# Patient Record
Sex: Female | Born: 1990
Health system: Southern US, Community
[De-identification: ages and names within clinical notes are randomized; demographics above are authoritative.]

## PROBLEM LIST (undated history)

## (undated) ENCOUNTER — Inpatient Hospital Stay (HOSPITAL_COMMUNITY): Payer: Self-pay

## (undated) DIAGNOSIS — R519 Headache, unspecified: Secondary | ICD-10-CM

## (undated) DIAGNOSIS — R51 Headache: Secondary | ICD-10-CM

## (undated) DIAGNOSIS — O139 Gestational [pregnancy-induced] hypertension without significant proteinuria, unspecified trimester: Secondary | ICD-10-CM

---
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
## 2008-07-22 ENCOUNTER — Observation Stay (HOSPITAL_COMMUNITY): Admission: AD | Admit: 2008-07-22 | Discharge: 2008-07-23 | Payer: Self-pay | Admitting: Obstetrics and Gynecology

## 2008-07-22 ENCOUNTER — Other Ambulatory Visit: Payer: Self-pay | Admitting: Emergency Medicine

## 2008-08-03 ENCOUNTER — Inpatient Hospital Stay (HOSPITAL_COMMUNITY): Admission: AD | Admit: 2008-08-03 | Discharge: 2008-08-07 | Payer: Self-pay | Admitting: Obstetrics and Gynecology

## 2009-10-17 ENCOUNTER — Emergency Department (HOSPITAL_COMMUNITY): Admission: EM | Admit: 2009-10-17 | Discharge: 2009-10-18 | Payer: Self-pay | Admitting: Emergency Medicine

## 2010-05-12 LAB — COMPREHENSIVE METABOLIC PANEL
ALT: 11 U/L (ref 0–35)
AST: 22 U/L (ref 0–37)
AST: 22 U/L (ref 0–37)
Albumin: 2.4 g/dL — ABNORMAL LOW (ref 3.5–5.2)
Alkaline Phosphatase: 134 U/L — ABNORMAL HIGH (ref 39–117)
BUN: 8 mg/dL (ref 6–23)
CO2: 22 mEq/L (ref 19–32)
CO2: 25 mEq/L (ref 19–32)
Calcium: 7.7 mg/dL — ABNORMAL LOW (ref 8.4–10.5)
Calcium: 8.5 mg/dL (ref 8.4–10.5)
Chloride: 101 mEq/L (ref 96–112)
Creatinine, Ser: 0.67 mg/dL (ref 0.4–1.2)
Creatinine, Ser: 0.8 mg/dL (ref 0.4–1.2)
GFR calc Af Amer: >60 mL/min (ref >60)
GFR calc Af Amer: >60 mL/min (ref >60)
GFR calc non Af Amer: >60 mL/min (ref >60)
GFR calc non Af Amer: >60 mL/min (ref >60)
Glucose, Bld: 65 mg/dL — ABNORMAL LOW (ref 70–99)
Potassium: 3.8 mEq/L (ref 3.5–5.1)
Sodium: 130 mEq/L — ABNORMAL LOW (ref 135–145)
Total Bilirubin: 0.8 mg/dL (ref 0.3–1.2)
Total Protein: 5.2 g/dL — ABNORMAL LOW (ref 6.0–8.3)

## 2010-05-12 LAB — CBC
HCT: 31.3 % — ABNORMAL LOW (ref 36.0–46.0)
HCT: 33.1 % — ABNORMAL LOW (ref 36.0–46.0)
Hemoglobin: 10.8 g/dL — ABNORMAL LOW (ref 12.0–15.0)
Hemoglobin: 11.5 g/dL — ABNORMAL LOW (ref 12.0–15.0)
MCHC: 34.6 g/dL (ref 30.0–36.0)
MCHC: 34.6 g/dL (ref 30.0–36.0)
MCHC: 34.7 g/dL (ref 30.0–36.0)
MCV: 90.5 fL (ref 78.0–100.0)
MCV: 90.5 fL (ref 78.0–100.0)
MCV: 91.7 fL (ref 78.0–100.0)
Platelets: 161 10*3/uL (ref 150–400)
Platelets: 163 10*3/uL (ref 150–400)
RBC: 3.15 MIL/uL — ABNORMAL LOW (ref 3.87–5.11)
RBC: 3.46 MIL/uL — ABNORMAL LOW (ref 3.87–5.11)
RBC: 3.66 MIL/uL — ABNORMAL LOW (ref 3.87–5.11)
RDW: 14.1 % (ref 11.5–15.5)
RDW: 14.7 % (ref 11.5–15.5)
WBC: 11.7 10*3/uL — ABNORMAL HIGH (ref 4.0–10.5)

## 2010-05-12 LAB — URINALYSIS, ROUTINE W REFLEX MICROSCOPIC
Ketones, ur: NEGATIVE mg/dL
Leukocytes, UA: NEGATIVE
Nitrite: NEGATIVE
Protein, ur: >300 mg/dL — AB
Urobilinogen, UA: 0.2 mg/dL (ref 0.0–1.0)

## 2010-05-12 LAB — LACTATE DEHYDROGENASE: LDH: 183 U/L (ref 94–250)

## 2010-05-12 LAB — URIC ACID
Uric Acid, Serum: 5.9 mg/dL (ref 2.4–7.0)
Uric Acid, Serum: 6.5 mg/dL (ref 2.4–7.0)

## 2010-05-13 LAB — POCT I-STAT, CHEM 8
BUN: 6 mg/dL (ref 6–23)
Hemoglobin: 10.2 g/dL — ABNORMAL LOW (ref 12.0–15.0)
Sodium: 135 mEq/L (ref 135–145)
TCO2: 21 mmol/L (ref 0–100)
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
## 2010-06-18 NOTE — Consult Note (Signed)
Carrie Bullock, Carrie Bullock               ACCOUNT NO.:  [id number]   MEDICAL RECORD NO.:  [id number]          PATIENT TYPE:  EMS   LOCATION:  ED                           FACILITY:  [facility]   PHYSICIAN:  Karol T. Lazarus Salines, M.D. DATE OF BIRTH:  Jun 21, 1990   DATE OF CONSULTATION:  DATE OF DISCHARGE:                                 CONSULTATION   CHIEF COMPLAINT:  Severe sore throat.   HISTORY OF PRESENT ILLNESS:  This is an 20 year old black female with a  sore throat over the past 6-7 days.  She presented to the emergency room  three days ago and received an intramuscular penicillin shot, presumably  Bicillin.  She kept the sore throat and today it became substantially  worse to the right side with radiation to the ear, tenderness in the  neck and pain with swallowing.  She has not had anything solid to eat  for more than one day.  She came into the emergency room where a right  peritonsillar abscess was identified and ENT was called in consultation.  She has never had prior trouble with an abscess in her throat, strep  throat, tonsillitis, or pharyngitis.  She is not diabetic or otherwise  immune compromised.  She is not pregnant.   PAST MEDICAL HISTORY:  She takes occasional Tylenol or Motrin.  She has  no known medical allergies.   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  She smokes between one third and one half packs per day.   FAMILY HISTORY:  Noncontributory.   REVIEW OF SYSTEMS:  Noncontributory.   PHYSICAL EXAMINATION:  GENERAL:  This is a thin adolescent black female  who smells rather strongly.  Mental status is basically appropriate.  HEENT:  She hears well in conversational speech.  Voice is phonatory but  with some dysarthria consistent with pain and swelling.  The head is  atraumatic and neck supple.  Cranial nerves intact.  Ear canals are  clear with normal aerated drums.  Anterior nose is clear and not  congested.  Oral cavity is  moist with teeth in good repair.  Oropharynx  shows chronic exudate on the surface of the right tonsil with bulging of  the right soft palate, erythema and displacement of the uvula towards  the left.  The left tonsil looks okay.  No obvious drainage.  NECK:  Slightly tender in the right jugulodigastric region but without  discrete nodes.   IMPRESSION:  Right peritonsillar abscess.   PLAN:  I discussed this with her.  I recommend incision and drainage,  preferably either under local anesthesia completely or under conscious  sedation protocol.  I do not think this will require a full general  anesthesia.  I discussed this with her and she is in agreement.  Questions were answered and informed consent was obtained.   After 2 mg of IV Versed and IV morphine, anesthesia was begun with  topical Hurricaine spray followed by injection of 1% Xylocaine with  1:100,000 epinephrine using a 22-gauge spinal needle.  She became quite  hysterical  and hyperventilatory at this point.  Two additional mg of  Versed and two additional mg of morphine allowed her to come down  satisfactorily.  The injections were completed and several minutes were  allowed for local anesthesia to take effect.   After allowing several minutes, a 2-cm crescent incision was made over  the superior pole of the right tonsil and carried down to an abscess  cavity.  Approximately 10 mL of frank pus was evacuated.  The opening  was enlarged at the tonsil hemostat and the suction tip was inserted  into the cavity and it was fully evacuated.  The patient tolerated this  reasonably well.  Hemostasis was spontaneous.  I will have her gargle  with saline and peroxide to clean up the old blood.   Prescriptions for amoxicillin liquid 1 gram t.i.d. for three days  followed by amoxicillin 500 mg t.i.d. for 1 week and Tylenol with  Codeine liquid for pain relief were written and given.  I will see her  back in one week in my office.  I  discussed instructions with her and  more importantly with her mother about advancement of diet and activity.  She knows to contact me sooner for problems.  Given an absent history of  prior tonsillitis, she does not require tonsillectomy, but if this  should happen again we will need to address this issue at that time.      Gloris Manchester. Lazarus Salines, M.D.  Electronically Signed     KTW/MEDQ  D:  02/05/2007  T:  02/05/2007  Job:  [id number]
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
## 2010-10-23 LAB — RAPID STREP SCREEN (MED CTR MEBANE ONLY): Streptococcus, Group A Screen (Direct): NEGATIVE

## 2010-12-12 ENCOUNTER — Encounter: Payer: Self-pay | Admitting: *Deleted

## 2010-12-12 ENCOUNTER — Emergency Department (HOSPITAL_COMMUNITY)
Admission: EM | Admit: 2010-12-12 | Discharge: 2010-12-12 | Disposition: A | Discharge status: Home / Self Care | Payer: Self-pay | Attending: Emergency Medicine | Admitting: Emergency Medicine

## 2010-12-12 DIAGNOSIS — F17200 Nicotine dependence, unspecified, uncomplicated: Secondary | ICD-10-CM | POA: Insufficient documentation

## 2010-12-12 DIAGNOSIS — J029 Acute pharyngitis, unspecified: Secondary | ICD-10-CM | POA: Insufficient documentation

## 2010-12-12 LAB — RAPID STREP SCREEN (MED CTR MEBANE ONLY): Streptococcus, Group A Screen (Direct): NEGATIVE

## 2010-12-12 MED ORDER — LIDOCAINE VISCOUS 2 % MT SOLN: 20.0000 mL | OROMUCOSAL | Status: DC | PRN

## 2010-12-12 MED ORDER — LIDOCAINE VISCOUS 2 % MT SOLN
20.0000 mL | Freq: Once | OROMUCOSAL | Status: AC
  Administered 2010-12-12: 20 mL via OROMUCOSAL
  Filled 2010-12-12: qty 30

## 2010-12-12 MED ORDER — IBUPROFEN 800 MG PO TABS: 800.0000 mg | ORAL_TABLET | Freq: Three times a day (TID) | ORAL | Status: DC

## 2010-12-12 MED ORDER — HYDROCODONE-ACETAMINOPHEN 5-325 MG PO TABS: 1.0000 | ORAL_TABLET | Freq: Four times a day (QID) | ORAL | Status: AC | PRN

## 2010-12-12 NOTE — ED Notes (Signed)
Pt c/o throat pain. Bilateral redness noted. Denies fevers

## 2010-12-12 NOTE — ED Notes (Signed)
Pt in c/o sore throat x1 week

## 2010-12-12 NOTE — ED Provider Notes (Signed)
Medical screening examination/treatment/procedure(s) were performed by non-physician practitioner and as supervising physician I was immediately available for consultation/collaboration.  Olivia Mackie, MD 12/12/10 [phone]

## 2010-12-12 NOTE — ED Provider Notes (Signed)
History   CSN: [id number] Arrival date & time: 12/12/2010 12:02 AM   First MD Initiated Contact with Patient 12/12/10 0027     Patient is a 20 y.o. female presenting with pharyngitis. The history is provided by the patient.  Sore Throat This is a new problem. The current episode started in the past 7 days. The problem occurs constantly. The problem has been gradually worsening. Associated symptoms include neck pain, a sore throat and swollen glands. Pertinent negatives include no abdominal pain, chest pain, chills, congestion, coughing, fever, headaches, myalgias, nausea, rash, vomiting or weakness. The symptoms are aggravated by swallowing, eating and drinking. She has tried nothing for the symptoms.  Patient reports today she found white exudate on the back of her left tonsil. Reports pain is worse on the left side of her throat. States pain radiates to her left ear when swallowing. Patient reports tender anterior cervical lymph nodes as well.  History reviewed. No pertinent past medical history.  History reviewed. No pertinent past surgical history.  History reviewed. No pertinent family history.  History  Substance Use Topics  . Smoking status: Current Everyday Smoker  . Smokeless tobacco: Not on file  . Alcohol Use: Yes    OB History    Grav Para Term Preterm Abortions TAB SAB Ect Mult Living                  Review of Systems  Constitutional: Negative for fever and chills.  HENT: Positive for sore throat, trouble swallowing and neck pain. Negative for ear pain, congestion, facial swelling, rhinorrhea, sneezing, neck stiffness, postnasal drip and sinus pressure.   Respiratory: Negative for cough, shortness of breath and wheezing.   Cardiovascular: Negative for chest pain.  Gastrointestinal: Negative for nausea, vomiting and abdominal pain.  Musculoskeletal: Negative for myalgias.  Skin: Negative for rash.  Neurological: Negative for dizziness, weakness, light-headedness and  headaches.  All other systems reviewed and are negative.    Allergies  Review of patient's allergies indicates no known allergies.  Home Medications  No current outpatient prescriptions on file.  BP 126/63  Pulse 89  Temp(Src) 98.3 F (36.8 C) (Oral)  Resp 22  SpO2 100%  Physical Exam  Vitals reviewed. Constitutional: She is oriented to person, place, and time. Vital signs are normal. She appears well-developed and well-nourished. No distress.  HENT:  Head: Normocephalic and atraumatic. No trismus in the jaw.  Right Ear: Tympanic membrane, external ear and ear canal normal.  Left Ear: Tympanic membrane, external ear and ear canal normal.  Nose: Nose normal.  Mouth/Throat: Uvula is midline and mucous membranes are normal. No dental abscesses or uvula swelling. Oropharyngeal exudate and posterior oropharyngeal erythema present. No posterior oropharyngeal edema or tonsillar abscesses.  Eyes: Pupils are equal, round, and reactive to light.  Neck: Trachea normal and normal range of motion. Neck supple. No spinous process tenderness and no muscular tenderness present. No mass and no thyromegaly present.  Pulmonary/Chest: Effort normal.  Lymphadenopathy:       Head (left side): Submandibular and tonsillar adenopathy present.  Neurological: She is alert and oriented to person, place, and time.  Skin: Skin is warm and dry. No rash noted. No erythema. No pallor.  Psychiatric: She has a normal mood and affect. Her behavior is normal.    ED Course  Procedures   Patient is a Centor Score 3. Will order Throat cultures and treat symptomatically.   MDM         Thomasene Lot,  PA 12/12/10 0101

## 2010-12-20 ENCOUNTER — Encounter (HOSPITAL_COMMUNITY): Payer: Self-pay | Admitting: Emergency Medicine

## 2010-12-20 ENCOUNTER — Emergency Department (HOSPITAL_COMMUNITY): Payer: Self-pay

## 2010-12-20 ENCOUNTER — Emergency Department (HOSPITAL_COMMUNITY)
Admission: EM | Admit: 2010-12-20 | Discharge: 2010-12-20 | Disposition: A | Discharge status: Home / Self Care | Payer: No Typology Code available for payment source | Attending: Emergency Medicine | Admitting: Emergency Medicine

## 2010-12-20 DIAGNOSIS — Y92410 Unspecified street and highway as the place of occurrence of the external cause: Secondary | ICD-10-CM | POA: Insufficient documentation

## 2010-12-20 DIAGNOSIS — M25559 Pain in unspecified hip: Secondary | ICD-10-CM

## 2010-12-20 DIAGNOSIS — F17200 Nicotine dependence, unspecified, uncomplicated: Secondary | ICD-10-CM | POA: Insufficient documentation

## 2010-12-20 DIAGNOSIS — M549 Dorsalgia, unspecified: Secondary | ICD-10-CM

## 2010-12-20 DIAGNOSIS — M545 Low back pain, unspecified: Secondary | ICD-10-CM | POA: Insufficient documentation

## 2010-12-20 DIAGNOSIS — T1490XA Injury, unspecified, initial encounter: Secondary | ICD-10-CM | POA: Insufficient documentation

## 2010-12-20 MED ORDER — HYDROCODONE-ACETAMINOPHEN 5-325 MG PO TABS: 1.0000 | ORAL_TABLET | Freq: Four times a day (QID) | ORAL | Status: AC | PRN

## 2010-12-20 NOTE — ED Notes (Signed)
Patient transported to X-ray 
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
## 2010-12-20 NOTE — ED Notes (Signed)
Patient transported to CT 
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
## 2011-06-25 ENCOUNTER — Other Ambulatory Visit (HOSPITAL_COMMUNITY)
Admission: RE | Admit: 2011-06-25 | Discharge: 2011-06-25 | Disposition: A | Discharge status: Home / Self Care | Payer: No Typology Code available for payment source | Source: Ambulatory Visit | Attending: Family Medicine | Admitting: Family Medicine

## 2011-06-25 ENCOUNTER — Other Ambulatory Visit: Payer: Self-pay | Admitting: Physician Assistant

## 2011-06-25 DIAGNOSIS — Z01419 Encounter for gynecological examination (general) (routine) without abnormal findings: Secondary | ICD-10-CM | POA: Insufficient documentation

## 2012-11-04 ENCOUNTER — Other Ambulatory Visit: Payer: Self-pay | Admitting: Physician Assistant

## 2012-11-04 ENCOUNTER — Other Ambulatory Visit (HOSPITAL_COMMUNITY)
Admission: RE | Admit: 2012-11-04 | Discharge: 2012-11-04 | Disposition: A | Discharge status: Home / Self Care | Payer: Medicaid Other | Source: Ambulatory Visit | Attending: Physician Assistant | Admitting: Physician Assistant

## 2012-11-04 DIAGNOSIS — R87810 Cervical high risk human papillomavirus (HPV) DNA test positive: Secondary | ICD-10-CM | POA: Insufficient documentation

## 2012-11-04 DIAGNOSIS — Z124 Encounter for screening for malignant neoplasm of cervix: Secondary | ICD-10-CM | POA: Insufficient documentation

## 2012-11-04 DIAGNOSIS — Z1151 Encounter for screening for human papillomavirus (HPV): Secondary | ICD-10-CM | POA: Insufficient documentation

## 2012-11-04 DIAGNOSIS — Z113 Encounter for screening for infections with a predominantly sexual mode of transmission: Secondary | ICD-10-CM | POA: Insufficient documentation

## 2012-12-15 ENCOUNTER — Other Ambulatory Visit: Payer: Self-pay | Admitting: Family Medicine

## 2013-03-11 ENCOUNTER — Emergency Department (HOSPITAL_COMMUNITY)
Admission: EM | Admit: 2013-03-11 | Discharge: 2013-03-11 | Disposition: A | Discharge status: Home / Self Care | Payer: Medicaid Other | Attending: Emergency Medicine | Admitting: Emergency Medicine

## 2013-03-11 DIAGNOSIS — Y9289 Other specified places as the place of occurrence of the external cause: Secondary | ICD-10-CM | POA: Insufficient documentation

## 2013-03-11 DIAGNOSIS — W2209XA Striking against other stationary object, initial encounter: Secondary | ICD-10-CM | POA: Insufficient documentation

## 2013-03-11 DIAGNOSIS — F17200 Nicotine dependence, unspecified, uncomplicated: Secondary | ICD-10-CM | POA: Insufficient documentation

## 2013-03-11 DIAGNOSIS — S0100XA Unspecified open wound of scalp, initial encounter: Secondary | ICD-10-CM | POA: Insufficient documentation

## 2013-03-11 DIAGNOSIS — Y9301 Activity, walking, marching and hiking: Secondary | ICD-10-CM | POA: Insufficient documentation

## 2013-03-11 DIAGNOSIS — S0101XA Laceration without foreign body of scalp, initial encounter: Secondary | ICD-10-CM

## 2013-03-11 MED ORDER — IBUPROFEN 800 MG PO TABS
800.0000 mg | ORAL_TABLET | Freq: Once | ORAL | Status: AC
  Administered 2013-03-11: 800 mg via ORAL
  Filled 2013-03-11: qty 1

## 2013-03-11 NOTE — ED Notes (Signed)
Pt hit head on cabinet in kitchen. Pt has lac to L side of head near forehead. Bleeding controlled. Pt denies LOC, nausea/vomiting. Pt alert, no acute distress.

## 2013-03-11 NOTE — ED Provider Notes (Signed)
CSN: [id number]     Arrival date & time 03/11/13  2228 History  This chart was scribed for Junius Finner, non-physician practitioner, working with Toy Baker, MD by Bennett Scrape, ED Scribe. This patient was seen in room WTR5/WTR5 and the patient's care was started at 10:50 PM.   Chief Complaint  Patient presents with  . Head Laceration    The history is provided by the patient. No language interpreter was used.    HPI Comments: Carrie Bullock is a 23 y.o. female who presents to the Emergency Department complaining of laceration to the left head that occurred when she hit her head on a kitchen cabinet 45 minutes ago. She states that the wooden cabinet door was open and she walked into it not realizing that it was open. The bleeding is controlled currently. She reports a mild burning pain associated with the laceration and rates her pain a 2 out of 10. She denies taking any OTC medications for the pain. She denies any LOC, nausea or emesis. She denies any other injuries. She denies having any h/o chronic medical problems. TD vaccine is UTD.  PCP is with [facility].   No past medical history on file. No past surgical history on file. Family History  Problem Relation Age of Onset  . Hypertension Mother    History  Substance Use Topics  . Smoking status: Current Every Day Smoker  . Smokeless tobacco: Not on file  . Alcohol Use: Yes   OB History   Grav Para Term Preterm Abortions TAB SAB Ect Mult Living   1 1             Review of Systems  Gastrointestinal: Negative for nausea and vomiting.  Skin: Positive for wound.  Neurological: Negative for syncope.  All other systems reviewed and are negative.    Allergies  Review of patient's allergies indicates no known allergies.  Home Medications   Current Outpatient Rx  Name  Route  Sig  Dispense  Refill  . aspirin-acetaminophen-caffeine (EXCEDRIN MIGRAINE) 250-250-65 MG per tablet   Oral   Take 1 tablet by mouth  every 6 (six) hours as needed for headache.           Triage Vitals: BP 142/81  Pulse 94  Temp(Src) 98.5 F (36.9 C) (Oral)  Resp 16  SpO2 100%  Physical Exam  Nursing note and vitals reviewed. Constitutional: She is oriented to person, place, and time. She appears well-developed and well-nourished.  HENT:  Head: Normocephalic and atraumatic.  2 cm skin flap to the left parietal scalp. Bleeding is controlled  Eyes: EOM are normal. Pupils are equal, round, and reactive to light.  Neck: Normal range of motion. Neck supple.  Cardiovascular: Normal rate.   Pulmonary/Chest: Effort normal.  Musculoskeletal: Normal range of motion.  Neurological: She is alert and oriented to person, place, and time. Coordination and gait normal. GCS eye subscore is 4. GCS verbal subscore is 5. GCS motor subscore is 6.  Skin: Skin is warm and dry.  Psychiatric: She has a normal mood and affect. Her behavior is normal.    ED Course  Procedures (including critical care time)  DIAGNOSTIC STUDIES: Oxygen Saturation is 100% on RA, normal by my interpretation.    COORDINATION OF CARE: 10:54 PM-Discussed treatment plan which includes wound care and one staple with pt at bedside and pt agreed to plan.   LACERATION REPAIR PROCEDURE NOTE The patient's identification was confirmed and consent was obtained.  This procedure was performed by Junius FinnerErin O'Malley, PA-C at 10:55 PM. Site: left parietal Sterile procedures observed Anesthetic used (type and amt): N/A Suture type/size: staple Length: 2 cm # of Staple: 1 Technique:well-approximated  Complexity: Simple Antibx ointment applied Tetanus UTD  Site irrigated with NS, explored without evidence of foreign body, wound well approximated, site covered with dry, sterile dressing.  Patient tolerated procedure well without complications. Instructions for care discussed verbally and patient provided with additional written instructions for homecare and  f/u.  Labs Review Labs Reviewed - No data to display Imaging Review No results found.  EKG Interpretation   None       MDM   1. Scalp laceration    Pt with 2cm head laceration after hitting cabinet this evening. No LOC, normal neuro exam. Do not believe imaging needed at this time. One staple placed. Advised to f/u with PCP or return to ED if needed in 5-7 days for staple removal. Return precautions provided. Pt verbalized understanding and agreement with tx plan.   I personally performed the services described in this documentation, which was scribed in my presence. The recorded information has been reviewed and is accurate.    Junius Finnerrin O'Malley, PA-C 03/11/13 2330

## 2013-03-12 NOTE — ED Provider Notes (Signed)
Medical screening examination/treatment/procedure(s) were performed by non-physician practitioner and as supervising physician I was immediately available for consultation/collaboration.  Toy BakerAnthony T Kynslei Art, MD 03/12/13 [phone]

## 2013-03-24 ENCOUNTER — Emergency Department (HOSPITAL_COMMUNITY)
Admission: EM | Admit: 2013-03-24 | Discharge: 2013-03-24 | Disposition: A | Discharge status: Home / Self Care | Payer: Medicaid Other | Attending: Emergency Medicine | Admitting: Emergency Medicine

## 2013-03-24 DIAGNOSIS — Z4802 Encounter for removal of sutures: Secondary | ICD-10-CM

## 2013-03-24 DIAGNOSIS — F17200 Nicotine dependence, unspecified, uncomplicated: Secondary | ICD-10-CM | POA: Insufficient documentation

## 2013-03-24 NOTE — Discharge Instructions (Signed)
Read the information below.  You may return to the Emergency Department at any time for worsening condition or any new symptoms that concern you.  If you develop redness, swelling, pus draining from the wound, or fevers greater than 100.4, return to the ER immediately for a recheck.   ° ° °Staple Removal °Care After °The staples used to close your skin have been removed. The wound needs continued care so it can heal completely and without problems. The care described here will need to be done for another 5-10 days unless your caregiver advises otherwise.  °HOME CARE INSTRUCTIONS  °· Keep wound site dry and clean. °· If skin adhesive strips were applied after the staples were removed, they will begin to peel off in a few days. If they remain after fourteen days, they may be peeled off and discarded. °· If you still have a dressing, change it at least once a day or as instructed by your caregiver. If the bandage sticks, soak it off with warm water. Pat dry with a clean towel. Look for signs of infection (see below). °· Reapply cream or ointment according to your caregiver's instruction. This will help prevent infection and keep the bandage from sticking. Use of a non-stick material over the wound and under the dressing or wrap will also help keep the bandage from sticking. °· If the bandage becomes wet, dirty or develops a foul smell, change it as soon as possible. °· New scars become sunburned easily. Use sunscreens with protection factor (SPF) of at least 15 when out in the sun. °· Only take over-the-counter or prescription medicines for pain, discomfort or fever as directed by your caregiver. °SEEK IMMEDIATE MEDICAL CARE IF:  °· There is redness, swelling or increasing pain in the wound. °· Pus is coming from the wound. °· An unexplained oral temperature above 102° F (38.9° C) develops. °· You notice a foul smell coming from the wound or dressing. °· There is a breaking open of the suture line (edges not staying  together) of the wound edges after staples have been removed. °Document Released: 01/03/2008 Document Revised: 04/14/2011 Document Reviewed: 01/03/2008 °ExitCare® Patient Information ©2014 [employer]. ° °

## 2013-03-24 NOTE — ED Notes (Signed)
Pt here for removal of one staple to L side of head near forehead. No redness, swelling or pus drainage noted from wound. Pt states she had staple placed on 2/2.

## 2013-03-24 NOTE — ED Provider Notes (Signed)
CSN: [id number]     Arrival date & time 03/24/13  1742 History  This chart was scribed for non-physician practitioner Trixie DredgeEmily Hien Cunliffe, PA-C working with Dagmar HaitWilliam Blair Walden, MD by Dorothey Basemania Sutton, ED Scribe. This patient was seen in room WTR5/WTR5 and the patient's care was started at 6:06 PM.    Chief Complaint  Patient presents with  . Suture / Staple Removal    The history is provided by the patient. No language interpreter was used.   HPI Comments: Carrie Bullock is a 23 y.o. female who presents to the Emergency Department requesting removal of one staple from the left side of the scalp that she received on 03/11/2013 for a laceration that she sustained after hitting her head on a table. She reports some intermittent headaches that resolved after taking Excedrin at home. She denies any erythema, drainage from the area, or fevers. Patient has no other pertinent medical history.   No past medical history on file. No past surgical history on file. Family History  Problem Relation Age of Onset  . Hypertension Mother    History  Substance Use Topics  . Smoking status: Current Every Day Smoker  . Smokeless tobacco: Not on file  . Alcohol Use: Yes   OB History   Grav Para Term Preterm Abortions TAB SAB Ect Mult Living   1 1             Review of Systems  Constitutional: Negative for fever.  Skin: Positive for wound. Negative for color change.  Allergic/Immunologic: Negative for immunocompromised state.  Neurological: Positive for headaches (resolved).    Allergies  Review of patient's allergies indicates no known allergies.  Home Medications   Current Outpatient Rx  Name  Route  Sig  Dispense  Refill  . aspirin-acetaminophen-caffeine (EXCEDRIN MIGRAINE) 250-250-65 MG per tablet   Oral   Take 1 tablet by mouth every 6 (six) hours as needed for headache.          Triage Vitals: BP 123/89  Pulse 89  Temp(Src) 98.3 F (36.8 C) (Oral)  Resp 16  SpO2 100%  Physical Exam   Nursing note and vitals reviewed. Constitutional: She appears well-developed and well-nourished. No distress.  HENT:  Head: Normocephalic.  Left parietal scalp with well-healed laceration. No erythema, edema, warmth, tenderness, or discharge.  Neck: Neck supple.  Pulmonary/Chest: Effort normal.  Neurological: She is alert.  Skin: She is not diaphoretic.    ED Course  Procedures (including critical care time)  DIAGNOSTIC STUDIES: Oxygen Saturation is 100% on room air, normal by my interpretation.    COORDINATION OF CARE: 6:06 PM- Removed the staple. Discussed treatment plan with patient at bedside and patient verbalized agreement.     Labs Review Labs Reviewed - No data to display Imaging Review No results found.  EKG Interpretation   None       MDM   Final diagnoses:  Encounter for staple removal    Pt presents for staple removal following laceration repair 2/6.  No complaints.  Laceration is well healed.  No e/o infection.  Discussed findings, treatment, and follow up  with patient.  Pt given return precautions.  Pt verbalizes understanding and agrees with plan.      I personally performed the services described in this documentation, which was scribed in my presence. The recorded information has been reviewed and is accurate.     EssexEmily Filiberto Wamble, PA-C 03/24/13 1846

## 2013-03-24 NOTE — ED Provider Notes (Signed)
Medical screening examination/treatment/procedure(s) were performed by non-physician practitioner and as supervising physician I was immediately available for consultation/collaboration.  EKG Interpretation   None         William Kihanna Kamiya, MD 03/24/13 2305 

## 2013-09-16 ENCOUNTER — Encounter (HOSPITAL_COMMUNITY): Payer: Self-pay | Admitting: *Deleted

## 2013-09-16 ENCOUNTER — Inpatient Hospital Stay (HOSPITAL_COMMUNITY)
Admission: AD | Admit: 2013-09-16 | Discharge: 2013-09-16 | Disposition: A | Discharge status: Home / Self Care | Payer: Medicaid Other | Source: Ambulatory Visit | Attending: Obstetrics and Gynecology | Admitting: Obstetrics and Gynecology

## 2013-09-16 DIAGNOSIS — O99330 Smoking (tobacco) complicating pregnancy, unspecified trimester: Secondary | ICD-10-CM | POA: Insufficient documentation

## 2013-09-16 DIAGNOSIS — R109 Unspecified abdominal pain: Secondary | ICD-10-CM | POA: Diagnosis present

## 2013-09-16 DIAGNOSIS — O99891 Other specified diseases and conditions complicating pregnancy: Secondary | ICD-10-CM | POA: Diagnosis not present

## 2013-09-16 DIAGNOSIS — Z3490 Encounter for supervision of normal pregnancy, unspecified, unspecified trimester: Secondary | ICD-10-CM

## 2013-09-16 DIAGNOSIS — O9989 Other specified diseases and conditions complicating pregnancy, childbirth and the puerperium: Principal | ICD-10-CM

## 2013-09-16 LAB — URINALYSIS, ROUTINE W REFLEX MICROSCOPIC
Bilirubin Urine: NEGATIVE
Glucose, UA: 250 mg/dL — AB
KETONES UR: 15 mg/dL — AB
LEUKOCYTES UA: NEGATIVE
NITRITE: NEGATIVE
PROTEIN: NEGATIVE mg/dL
Specific Gravity, Urine: 1.02 (ref 1.005–1.030)
UROBILINOGEN UA: 1 mg/dL (ref 0.0–1.0)
pH: 6.5 (ref 5.0–8.0)

## 2013-09-16 LAB — POCT PREGNANCY, URINE: Preg Test, Ur: POSITIVE — AB

## 2013-09-16 LAB — URINE MICROSCOPIC-ADD ON

## 2013-09-16 NOTE — MAU Note (Signed)
Hasn't had a period yet,  Did a [employer] test, was positive.  Been having cramping like period was going to start. Concerned because had 'celebrated friends birthday', if she is pregnant- wants to make sure everything is ok.

## 2013-09-16 NOTE — MAU Provider Note (Signed)
°  History     CSN: [id number]  Arrival date and time: 09/16/13 1337   None     Chief Complaint  Patient presents with   Abdominal Pain   Possible Pregnancy   HPI Carrie Bullock 23 y.o. [id number] presents to MAU to confirm pregnancy and be sure everything is okay.  She had positive home PRT on Wednesday.  She consumed alcohol at a party on 8/8 - 4 mixed drinks but this was an isolated event.  She smokes 10 cig/day.  She denies abdominal pain presently, also no bleeding, nausea, vomiting, dysuria.    OB History   Grav Para Term Preterm Abortions TAB SAB Ect Mult Living   2 1   1 1    1       History reviewed. No pertinent past medical history.  History reviewed. No pertinent past surgical history.  Family History  Problem Relation Age of Onset   Hypertension Mother     History  Substance Use Topics   Smoking status: Current Every Day Smoker   Smokeless tobacco: Not on file   Alcohol Use: Yes     Comment: LAST DRANK- 09-05-2013    Allergies: No Known Allergies  Prescriptions prior to admission  Medication Sig Dispense Refill   Aspirin-Salicylamide-Caffeine (BC HEADACHE POWDER PO) Take 1 packet by mouth daily as needed (for headache.).       aspirin-acetaminophen-caffeine (EXCEDRIN MIGRAINE) 250-250-65 MG per tablet Take 1 tablet by mouth every 6 (six) hours as needed for headache.        Review of Systems  Constitutional: Negative for fever and chills.  HENT: Negative for sore throat.   Eyes: Negative for blurred vision.  Respiratory: Negative for cough and shortness of breath.   Cardiovascular: Negative for chest pain and palpitations.  Gastrointestinal: Positive for abdominal pain. Negative for heartburn, nausea, vomiting, diarrhea and constipation.  Genitourinary: Negative for dysuria, urgency, frequency and hematuria.  Skin: Negative for rash.  Neurological: Negative for dizziness, weakness and headaches.   Physical Exam   Blood pressure 129/76, pulse  98, temperature 99.5 F (37.5 C), temperature source Oral, resp. rate 18, height 5' 4.5" (1.638 m), weight 86.637 kg (191 lb), last menstrual period 08/04/2013, unknown if currently breastfeeding.  Physical Exam  Constitutional: She is oriented to person, place, and time. She appears well-developed and well-nourished. No distress.  HENT:  Head: Normocephalic and atraumatic.  Eyes: EOM are normal.  Neck: Normal range of motion.  Cardiovascular: Normal rate and regular rhythm.   Respiratory: Breath sounds normal. No respiratory distress.  GI: Soft. Bowel sounds are normal. She exhibits no distension. There is no tenderness.  Musculoskeletal: Normal range of motion.  Neurological: She is alert and oriented to person, place, and time.  Skin: Skin is warm and dry.  Psychiatric: She has a normal mood and affect.    MAU Course  Procedures none  MDM +PRT/No pain/no bleeding  Assessment and Plan  A:  Early pregnancy without pain or bleeding  Plan: [facility]charge to [facility] - given resources Return to MAU for emergency  Bertram DenverKaren E Teague Clark 09/16/2013, 3:43 PM

## 2013-09-16 NOTE — Discharge Instructions (Signed)
First Trimester of Pregnancy The first trimester of pregnancy is from week 1 until the end of week 12 (months 1 through 3). A week after a sperm fertilizes an egg, the egg will implant on the wall of the uterus. This embryo will begin to develop into a baby. Genes from you and your partner are forming the baby. The female genes determine whether the baby is a boy or a girl. At 6-8 weeks, the eyes and face are formed, and the heartbeat can be seen on ultrasound. At the end of 12 weeks, all the baby's organs are formed.  Now that you are pregnant, you will want to do everything you can to have a healthy baby. Two of the most important things are to get good prenatal care and to follow your health care provider's instructions. Prenatal care is all the medical care you receive before the baby's birth. This care will help prevent, find, and treat any problems during the pregnancy and childbirth. BODY CHANGES Your body goes through many changes during pregnancy. The changes vary from woman to woman.   You may gain or lose a couple of pounds at first.  You may feel sick to your stomach (nauseous) and throw up (vomit). If the vomiting is uncontrollable, call your health care provider.  You may tire easily.  You may develop headaches that can be relieved by medicines approved by your health care provider.  You may urinate more often. Painful urination may mean you have a bladder infection.  You may develop heartburn as a result of your pregnancy.  You may develop constipation because certain hormones are causing the muscles that push waste through your intestines to slow down.  You may develop hemorrhoids or swollen, bulging veins (varicose veins).  Your breasts may begin to grow larger and become tender. Your nipples may stick out more, and the tissue that surrounds them (areola) may become darker.  Your gums may bleed and may be sensitive to brushing and flossing.  Dark spots or blotches (chloasma,  mask of pregnancy) may develop on your face. This will likely fade after the baby is born.  Your menstrual periods will stop.  You may have a loss of appetite.  You may develop cravings for certain kinds of food.  You may have changes in your emotions from day to day, such as being excited to be pregnant or being concerned that something may go wrong with the pregnancy and baby.  You may have more vivid and strange dreams.  You may have changes in your hair. These can include thickening of your hair, rapid growth, and changes in texture. Some women also have hair loss during or after pregnancy, or hair that feels dry or thin. Your hair will most likely return to normal after your baby is born. WHAT TO EXPECT AT YOUR PRENATAL VISITS During a routine prenatal visit:  You will be weighed to make sure you and the baby are growing normally.  Your blood pressure will be taken.  Your abdomen will be measured to track your baby's growth.  The fetal heartbeat will be listened to starting around week 10 or 12 of your pregnancy.  Test results from any previous visits will be discussed. Your health care provider may ask you:  How you are feeling.  If you are feeling the baby move.  If you have had any abnormal symptoms, such as leaking fluid, bleeding, severe headaches, or abdominal cramping.  If you have any questions. Other tests   that may be performed during your first trimester include:  Blood tests to find your blood type and to check for the presence of any previous infections. They will also be used to check for low iron levels (anemia) and Rh antibodies. Later in the pregnancy, blood tests for diabetes will be done along with other tests if problems develop.  Urine tests to check for infections, diabetes, or protein in the urine.  An ultrasound to confirm the proper growth and development of the baby.  An amniocentesis to check for possible genetic problems.  Fetal screens for  spina bifida and Down syndrome.  You may need other tests to make sure you and the baby are doing well. HOME CARE INSTRUCTIONS  Medicines  Follow your health care provider's instructions regarding medicine use. Specific medicines may be either safe or unsafe to take during pregnancy.  Take your prenatal vitamins as directed.  If you develop constipation, try taking a stool softener if your health care provider approves. Diet  Eat regular, well-balanced meals. Choose a variety of foods, such as meat or vegetable-based protein, fish, milk and low-fat dairy products, vegetables, fruits, and whole grain breads and cereals. Your health care provider will help you determine the amount of weight gain that is right for you.  Avoid raw meat and uncooked cheese. These carry germs that can cause birth defects in the baby.  Eating four or five small meals rather than three large meals a day may help relieve nausea and vomiting. If you start to feel nauseous, eating a few soda crackers can be helpful. Drinking liquids between meals instead of during meals also seems to help nausea and vomiting.  If you develop constipation, eat more high-fiber foods, such as fresh vegetables or fruit and whole grains. Drink enough fluids to keep your urine clear or pale yellow. Activity and Exercise  Exercise only as directed by your health care provider. Exercising will help you:  Control your weight.  Stay in shape.  Be prepared for labor and delivery.  Experiencing pain or cramping in the lower abdomen or low back is a good sign that you should stop exercising. Check with your health care provider before continuing normal exercises.  Try to avoid standing for long periods of time. Move your legs often if you must stand in one place for a long time.  Avoid heavy lifting.  Wear low-heeled shoes, and practice good posture.  You may continue to have sex unless your health care provider directs you  otherwise. Relief of Pain or Discomfort  Wear a good support bra for breast tenderness.   Take warm sitz baths to soothe any pain or discomfort caused by hemorrhoids. Use hemorrhoid cream if your health care provider approves.   Rest with your legs elevated if you have leg cramps or low back pain.  If you develop varicose veins in your legs, wear support hose. Elevate your feet for 15 minutes, 3-4 times a day. Limit salt in your diet. Prenatal Care  Schedule your prenatal visits by the twelfth week of pregnancy. They are usually scheduled monthly at first, then more often in the last 2 months before delivery.  Write down your questions. Take them to your prenatal visits.  Keep all your prenatal visits as directed by your health care provider. Safety  Wear your seat belt at all times when driving.  Make a list of emergency phone numbers, including numbers for family, friends, the hospital, and police and fire departments. General Tips    Ask your health care provider for a referral to a local prenatal education class. Begin classes no later than at the beginning of month 6 of your pregnancy.  Ask for help if you have counseling or nutritional needs during pregnancy. Your health care provider can offer advice or refer you to specialists for help with various needs.  Do not use hot tubs, steam rooms, or saunas.  Do not douche or use tampons or scented sanitary pads.  Do not cross your legs for long periods of time.  Avoid cat litter boxes and soil used by cats. These carry germs that can cause birth defects in the baby and possibly loss of the fetus by miscarriage or stillbirth.  Avoid all smoking, herbs, alcohol, and medicines not prescribed by your health care provider. Chemicals in these affect the formation and growth of the baby.  Schedule a dentist appointment. At home, brush your teeth with a soft toothbrush and be gentle when you floss. SEEK MEDICAL CARE IF:   You have  dizziness.  You have mild pelvic cramps, pelvic pressure, or nagging pain in the abdominal area.  You have persistent nausea, vomiting, or diarrhea.  You have a bad smelling vaginal discharge.  You have pain with urination.  You notice increased swelling in your face, hands, legs, or ankles. SEEK IMMEDIATE MEDICAL CARE IF:   You have a fever.  You are leaking fluid from your vagina.  You have spotting or bleeding from your vagina.  You have severe abdominal cramping or pain.  You have rapid weight gain or loss.  You vomit blood or material that looks like coffee grounds.  You are exposed to German measles and have never had them.  You are exposed to fifth disease or chickenpox.  You develop a severe headache.  You have shortness of breath.  You have any kind of trauma, such as from a fall or a car accident. Document Released: 01/14/2001 Document Revised: 06/06/2013 Document Reviewed: 11/30/2012 ExitCare Patient Information 2015 [employer]. This information is not intended to replace advice given to you by your health care provider. Make sure you discuss any questions you have with your health care provider.  

## 2013-09-16 NOTE — MAU Note (Signed)
HAD DISCUSSED AT LENGTH  ABOUT FOLLOW-UP  ABOUT ABNL PAP FROM [facility]-  BECAUSE HER  DAD DIED FROM  LUNG CA.   ,      NEEDS TO GET PNC STARTED.    NEEDS TO STOP SMOKING.

## 2013-09-16 NOTE — MAU Note (Signed)
SAYS HER LMP WAS 7-2  AND SAYS HER LOWER ABD HURTS SINCE  Tuesday,  AND BREAST SORE-  STARTED  WED.  SHE TOOK  HPT  ON WED-  POSITIVE.  LAST SEX-  Monday.   NO BIRTH CONTROL.    WAS AT [facility]  MARCH 2015-   HAD ABNL PAP SMEAR ,  .     NO PROBLEMS    WITH URINATION.

## 2013-09-18 NOTE — MAU Provider Note (Signed)
Attestation of Attending Supervision of Advanced Practitioner (CNM/NP): Evaluation and management procedures were performed by the Advanced Practitioner under my supervision and collaboration.  I have reviewed the Advanced Practitioner's note and chart, and I agree with the management and plan.  Ann-Marie Kluge 09/18/2013 7:56 AM   

## 2013-09-30 LAB — OB RESULTS CONSOLE RUBELLA ANTIBODY, IGM: Rubella: IMMUNE

## 2013-09-30 LAB — OB RESULTS CONSOLE GC/CHLAMYDIA
Chlamydia: NEGATIVE
Gonorrhea: NEGATIVE

## 2013-09-30 LAB — OB RESULTS CONSOLE HEPATITIS B SURFACE ANTIGEN: Hepatitis B Surface Ag: NEGATIVE

## 2013-11-01 ENCOUNTER — Encounter (HOSPITAL_COMMUNITY): Payer: Self-pay | Admitting: *Deleted

## 2013-11-01 ENCOUNTER — Inpatient Hospital Stay (HOSPITAL_COMMUNITY)
Admission: AD | Admit: 2013-11-01 | Discharge: 2013-11-01 | Discharge status: Against Medical Advice | Payer: Medicaid Other | Source: Ambulatory Visit | Attending: Obstetrics and Gynecology | Admitting: Obstetrics and Gynecology

## 2013-11-01 DIAGNOSIS — O99891 Other specified diseases and conditions complicating pregnancy: Secondary | ICD-10-CM | POA: Diagnosis not present

## 2013-11-01 DIAGNOSIS — R109 Unspecified abdominal pain: Secondary | ICD-10-CM | POA: Insufficient documentation

## 2013-11-01 DIAGNOSIS — R209 Unspecified disturbances of skin sensation: Secondary | ICD-10-CM | POA: Diagnosis not present

## 2013-11-01 DIAGNOSIS — O9989 Other specified diseases and conditions complicating pregnancy, childbirth and the puerperium: Principal | ICD-10-CM

## 2013-11-01 LAB — URINALYSIS, ROUTINE W REFLEX MICROSCOPIC
Bilirubin Urine: NEGATIVE
Glucose, UA: NEGATIVE mg/dL
Hgb urine dipstick: NEGATIVE
KETONES UR: NEGATIVE mg/dL
Leukocytes, UA: NEGATIVE
NITRITE: NEGATIVE
Protein, ur: NEGATIVE mg/dL
Specific Gravity, Urine: 1.015 (ref 1.005–1.030)
Urobilinogen, UA: 0.2 mg/dL (ref 0.0–1.0)
pH: 7 (ref 5.0–8.0)

## 2013-11-01 NOTE — MAU Note (Signed)
Not in lobby

## 2013-11-01 NOTE — MAU Note (Signed)
Having really really bad stomach pains in lower abd. Been going on over a wk. Was trying to hold off for dr's visit, but it is getting worse.  Has been having a burning sensation in rt and left LQ.  Really hasn't seen a dr yet, wants to make sure everything is ok.

## 2013-12-05 ENCOUNTER — Encounter (HOSPITAL_COMMUNITY): Payer: Self-pay | Admitting: *Deleted

## 2014-02-03 NOTE — L&D Delivery Note (Signed)
1632: Nurse call stating patient reporting increased rectal pressure and urge to push.  In to assess and patient C/C/+3,+4.  Room prepped for delivery and NICU called for known IUGR and Severe PreEclampsia with MgSO4 Infusion.  FHT remained reassuring throughout pushing phase and patient delivered as below with staff and family support.    Delivery Note At 5:09 PM a viable female "Jaclynn Majoraylor Anthony" was delivered via Vaginal, Spontaneous Delivery (Presentation: Left Occiput Anterior). Head, shoulders, and body delivered in one push with short cord noted. Infant with good tone, but cord that was cut immediately as tactile stimulation was given by provider.  Infant immediately given to nurse who took her to NICU team for evaluation.  Infant APGARs: 8, 9. Placenta delivered spontaneously and noted to be intact with 3VC upon inspection, but was sent to pathology due to maternal complications.  Vaginal inspection revealed no lacerations.  Fundus firm, at the umbilicus, and bleeding small.  Mother hemodynamically stable and allowed to see infant prior to her being taken to NICU for observation.  Mother unsure of birth control method and opts to bottlefeed.  Infant weight at one hour of life: 3lbs 11.3oz, 17.72in.  Anesthesia: Epidural  Episiotomy: None Lacerations: None Suture Repair: None Est. Blood Loss (mL): 150  Mom to AICU.  Baby to NICU.  Marlene BastMLY, Darlen Gledhill [name] [name], CNM 04/01/2014, 6:10 PM

## 2014-03-29 ENCOUNTER — Inpatient Hospital Stay (HOSPITAL_COMMUNITY)
Admission: AD | Admit: 2014-03-29 | Discharge: 2014-04-03 | DRG: 774 | Disposition: A | Discharge status: Home / Self Care | Payer: Medicaid Other | Source: Ambulatory Visit | Attending: Obstetrics and Gynecology | Admitting: Obstetrics and Gynecology

## 2014-03-29 ENCOUNTER — Encounter (HOSPITAL_COMMUNITY): Payer: Self-pay | Admitting: *Deleted

## 2014-03-29 DIAGNOSIS — O1490 Unspecified pre-eclampsia, unspecified trimester: Secondary | ICD-10-CM | POA: Diagnosis present

## 2014-03-29 DIAGNOSIS — O99334 Smoking (tobacco) complicating childbirth: Secondary | ICD-10-CM | POA: Diagnosis present

## 2014-03-29 DIAGNOSIS — O1413 Severe pre-eclampsia, third trimester: Secondary | ICD-10-CM | POA: Diagnosis not present

## 2014-03-29 DIAGNOSIS — O368390 Maternal care for abnormalities of the fetal heart rate or rhythm, unspecified trimester, not applicable or unspecified: Secondary | ICD-10-CM

## 2014-03-29 DIAGNOSIS — Z3A33 33 weeks gestation of pregnancy: Secondary | ICD-10-CM | POA: Diagnosis present

## 2014-03-29 DIAGNOSIS — Q512 Other doubling of uterus: Secondary | ICD-10-CM | POA: Diagnosis not present

## 2014-03-29 DIAGNOSIS — Z8249 Family history of ischemic heart disease and other diseases of the circulatory system: Secondary | ICD-10-CM | POA: Diagnosis not present

## 2014-03-29 DIAGNOSIS — Z3A34 34 weeks gestation of pregnancy: Secondary | ICD-10-CM | POA: Insufficient documentation

## 2014-03-29 DIAGNOSIS — O1403 Mild to moderate pre-eclampsia, third trimester: Secondary | ICD-10-CM | POA: Diagnosis not present

## 2014-03-29 DIAGNOSIS — O34593 Maternal care for other abnormalities of gravid uterus, third trimester: Secondary | ICD-10-CM | POA: Diagnosis present

## 2014-03-29 DIAGNOSIS — O1410 Severe pre-eclampsia, unspecified trimester: Secondary | ICD-10-CM | POA: Diagnosis present

## 2014-03-29 DIAGNOSIS — O365930 Maternal care for other known or suspected poor fetal growth, third trimester, not applicable or unspecified: Secondary | ICD-10-CM | POA: Diagnosis present

## 2014-03-29 DIAGNOSIS — IMO0002 Reserved for concepts with insufficient information to code with codable children: Secondary | ICD-10-CM | POA: Insufficient documentation

## 2014-03-29 DIAGNOSIS — O1493 Unspecified pre-eclampsia, third trimester: Secondary | ICD-10-CM

## 2014-03-29 HISTORY — DX: Headache: R51

## 2014-03-29 HISTORY — DX: Headache, unspecified: R51.9

## 2014-03-29 HISTORY — DX: Gestational (pregnancy-induced) hypertension without significant proteinuria, unspecified trimester: O13.9

## 2014-03-29 LAB — CBC
HCT: 33.3 % — ABNORMAL LOW (ref 36.0–46.0)
Hemoglobin: 11.5 g/dL — ABNORMAL LOW (ref 12.0–15.0)
MCH: 29.5 pg (ref 26.0–34.0)
MCHC: 34.5 g/dL (ref 30.0–36.0)
MCV: 85.4 fL (ref 78.0–100.0)
Platelets: 151 10*3/uL (ref 150–400)
RBC: 3.9 MIL/uL (ref 3.87–5.11)
RDW: 13.5 % (ref 11.5–15.5)
WBC: 9 10*3/uL (ref 4.0–10.5)

## 2014-03-29 LAB — URINALYSIS, ROUTINE W REFLEX MICROSCOPIC
Bilirubin Urine: NEGATIVE
Glucose, UA: NEGATIVE mg/dL
Ketones, ur: NEGATIVE mg/dL
LEUKOCYTES UA: NEGATIVE
NITRITE: NEGATIVE
Protein, ur: >300 mg/dL — AB
SPECIFIC GRAVITY, URINE: 1.015 (ref 1.005–1.030)
UROBILINOGEN UA: 0.2 mg/dL (ref 0.0–1.0)
pH: 7 (ref 5.0–8.0)

## 2014-03-29 LAB — URINE MICROSCOPIC-ADD ON

## 2014-03-29 LAB — COMPREHENSIVE METABOLIC PANEL
ALT: 14 U/L (ref 0–35)
ANION GAP: 4 — AB (ref 5–15)
AST: 21 U/L (ref 0–37)
Albumin: 2.2 g/dL — ABNORMAL LOW (ref 3.5–5.2)
Alkaline Phosphatase: 79 U/L (ref 39–117)
BUN: 16 mg/dL (ref 6–23)
CALCIUM: 8.3 mg/dL — AB (ref 8.4–10.5)
CO2: 19 mmol/L (ref 19–32)
CREATININE: 0.9 mg/dL (ref 0.50–1.10)
Chloride: 111 mmol/L (ref 96–112)
GFR, EST NON AFRICAN AMERICAN: 88 mL/min — AB (ref >90)
GLUCOSE: 126 mg/dL — AB (ref 70–99)
Potassium: 3.8 mmol/L (ref 3.5–5.1)
Sodium: 134 mmol/L — ABNORMAL LOW (ref 135–145)
Total Bilirubin: 0.4 mg/dL (ref 0.3–1.2)
Total Protein: 5.5 g/dL — ABNORMAL LOW (ref 6.0–8.3)

## 2014-03-29 LAB — URIC ACID: URIC ACID, SERUM: 7.2 mg/dL — AB (ref 2.4–7.0)

## 2014-03-29 LAB — PROTEIN / CREATININE RATIO, URINE
Creatinine, Urine: 82 mg/dL
PROTEIN CREATININE RATIO: 12.07 — AB (ref 0.00–0.15)
Total Protein, Urine: 990 mg/dL

## 2014-03-29 LAB — LACTATE DEHYDROGENASE: LDH: 203 U/L (ref 94–250)

## 2014-03-29 LAB — GROUP B STREP BY PCR: GROUP B STREP BY PCR: NEGATIVE

## 2014-03-29 LAB — OB RESULTS CONSOLE HIV ANTIBODY (ROUTINE TESTING): HIV: NONREACTIVE

## 2014-03-29 LAB — OB RESULTS CONSOLE GBS: GBS: NEGATIVE

## 2014-03-29 LAB — TYPE AND SCREEN
ABO/RH(D): O POS
ANTIBODY SCREEN: NEGATIVE

## 2014-03-29 MED ORDER — LABETALOL HCL 5 MG/ML IV SOLN
10.0000 mg | INTRAVENOUS | Status: DC | PRN
  Administered 2014-03-29 – 2014-04-02 (×3): 10 mg via INTRAVENOUS
  Filled 2014-03-29 (×3): qty 4

## 2014-03-29 MED ORDER — DOCUSATE SODIUM 100 MG PO CAPS
100.0000 mg | ORAL_CAPSULE | Freq: Every day | ORAL | Status: DC
  Administered 2014-03-29 – 2014-03-30 (×2): 100 mg via ORAL
  Filled 2014-03-29 (×2): qty 1

## 2014-03-29 MED ORDER — ACETAMINOPHEN 325 MG PO TABS: 650.0000 mg | ORAL_TABLET | ORAL | Status: DC | PRN

## 2014-03-29 MED ORDER — OXYCODONE-ACETAMINOPHEN 5-325 MG PO TABS
2.0000 | ORAL_TABLET | Freq: Once | ORAL | Status: AC
  Administered 2014-03-29: 2 via ORAL
  Filled 2014-03-29: qty 2

## 2014-03-29 MED ORDER — CALCIUM CARBONATE ANTACID 500 MG PO CHEW
2.0000 | CHEWABLE_TABLET | ORAL | Status: DC | PRN
  Administered 2014-03-30 – 2014-03-31 (×3): 400 mg via ORAL
  Filled 2014-03-29: qty 1
  Filled 2014-03-29: qty 2
  Filled 2014-03-29 (×3): qty 1

## 2014-03-29 MED ORDER — ZOLPIDEM TARTRATE 5 MG PO TABS
5.0000 mg | ORAL_TABLET | Freq: Every evening | ORAL | Status: DC | PRN
  Administered 2014-03-30 (×2): 5 mg via ORAL
  Filled 2014-03-29 (×2): qty 1

## 2014-03-29 MED ORDER — SODIUM CHLORIDE 0.9 % IJ SOLN
3.0000 mL | Freq: Two times a day (BID) | INTRAMUSCULAR | Status: DC
  Administered 2014-03-29 – 2014-03-30 (×3): 3 mL via INTRAVENOUS

## 2014-03-29 MED ORDER — PRENATAL MULTIVITAMIN CH
1.0000 | ORAL_TABLET | Freq: Every day | ORAL | Status: DC
Start: 2014-03-30 — End: 2014-03-31
  Administered 2014-03-30: 1 via ORAL
  Filled 2014-03-29: qty 1

## 2014-03-29 MED ORDER — BETAMETHASONE SOD PHOS & ACET 6 (3-3) MG/ML IJ SUSP
12.0000 mg | Freq: Once | INTRAMUSCULAR | Status: AC
  Administered 2014-03-29: 12 mg via INTRAMUSCULAR
  Filled 2014-03-29: qty 2

## 2014-03-29 NOTE — MAU Provider Note (Signed)
History    Carrie Bullock is a 24 y.o. G3P1011 at 33.6wks who presents, from office, for elevated bp and headache.  Patient states headache started this morning, but swelling has been apparent for about 3 days.  Patient states swelling started in her feet and have moved up to thighs.  Patient reports swelling is now constant and has not subsided with elevation like in the past.  Patient states that she has not attempted to treat HA and has ate with no change in headache intensity.  Patient reports active fetus and denies LOF, VB, and contractions.  Patient also denies visual disturbances, epigastric pain, and numbness/tingling.  Patient is a smoker.  Per Dr. AVS office note, patient with BPP of 6/8, elevated, but no reverse or absent flow, dopplers, and growth in 2nd%ile at 3lbs 13oz.   There are no active problems to display for this patient.   Chief Complaint  Patient presents with  . Hypertension   HPI  OB History    Gravida Para Term Preterm AB TAB SAB Ectopic Multiple Living   3 1   1 1    1       Past Medical History  Diagnosis Date  . Pregnancy induced hypertension   . Headache     History reviewed. No pertinent past surgical history.  Family History  Problem Relation Age of Onset  . Hypertension Mother     History  Substance Use Topics  . Smoking status: Current Every Day Smoker -- 0.50 packs/day  . Smokeless tobacco: Not on file  . Alcohol Use: Yes     Comment: LAST DRANK- 09-05-2013    Allergies: No Known Allergies  Prescriptions prior to admission  Medication Sig Dispense Refill Last Dose  . cyclobenzaprine (FLEXERIL) 10 MG tablet Take 10 mg by mouth 3 (three) times daily as needed for muscle spasms.   Past Week at Unknown time    Review of Systems  Eyes: Negative for blurred vision, double vision and photophobia.  Cardiovascular: Positive for leg swelling. Negative for chest pain.  Gastrointestinal: Negative for heartburn, nausea, vomiting and abdominal  pain.  Genitourinary: Negative for dysuria.  Neurological: Positive for headaches. Negative for dizziness.    See HPI Above Physical Exam   Blood pressure 162/103, pulse 71, temperature 98.2 F (36.8 C), temperature source Oral, resp. rate 20, last menstrual period 08/04/2013, SpO2 100 %, unknown if currently breastfeeding.  Filed Vitals:   03/29/14 1429 03/29/14 1433 03/29/14 1447 03/29/14 1503  BP: 145/91 149/93 144/89 144/97  Pulse: 77 74 79 77  Temp:      TempSrc:      Resp: 20     SpO2:         Results for orders placed or performed during the hospital encounter of 03/29/14 (from the past 24 hour(s))  Urinalysis, Routine w reflex microscopic     Status: Abnormal   Collection Time: 03/29/14  1:40 PM  Result Value Ref Range   Color, Urine YELLOW YELLOW   APPearance CLOUDY (A) CLEAR   Specific Gravity, Urine 1.015 1.005 - 1.030   pH 7.0 5.0 - 8.0   Glucose, UA NEGATIVE NEGATIVE mg/dL   Hgb urine dipstick MODERATE (A) NEGATIVE   Bilirubin Urine NEGATIVE NEGATIVE   Ketones, ur NEGATIVE NEGATIVE mg/dL   Protein, ur >161>300 (A) NEGATIVE mg/dL   Urobilinogen, UA 0.2 0.0 - 1.0 mg/dL   Nitrite NEGATIVE NEGATIVE   Leukocytes, UA NEGATIVE NEGATIVE  Urine microscopic-add on  Status: Abnormal   Collection Time: 03/29/14  1:40 PM  Result Value Ref Range   Squamous Epithelial / LPF MANY (A) RARE   WBC, UA 0-2 <3 WBC/hpf   Bacteria, UA FEW (A) RARE  CBC     Status: Abnormal   Collection Time: 03/29/14  2:00 PM  Result Value Ref Range   WBC 9.0 4.0 - 10.5 K/uL   RBC 3.90 3.87 - 5.11 MIL/uL   Hemoglobin 11.5 (L) 12.0 - 15.0 g/dL   HCT 16.1 (L) 09.6 - 04.5 %   MCV 85.4 78.0 - 100.0 fL   MCH 29.5 26.0 - 34.0 pg   MCHC 34.5 30.0 - 36.0 g/dL   RDW 40.9 81.1 - 91.4 %   Platelets 151 150 - 400 K/uL  Comprehensive metabolic panel     Status: Abnormal   Collection Time: 03/29/14  2:00 PM  Result Value Ref Range   Sodium 134 (L) 135 - 145 mmol/L   Potassium 3.8 3.5 - 5.1 mmol/L    Chloride 111 96 - 112 mmol/L   CO2 19 19 - 32 mmol/L   Glucose, Bld 126 (H) 70 - 99 mg/dL   BUN 16 6 - 23 mg/dL   Creatinine, Ser 7.82 0.50 - 1.10 mg/dL   Calcium 8.3 (L) 8.4 - 10.5 mg/dL   Total Protein 5.5 (L) 6.0 - 8.3 g/dL   Albumin 2.2 (L) 3.5 - 5.2 g/dL   AST 21 0 - 37 U/L   ALT 14 0 - 35 U/L   Alkaline Phosphatase 79 39 - 117 U/L   Total Bilirubin 0.4 0.3 - 1.2 mg/dL   GFR calc non Af Amer 88 (L) >90 mL/min   GFR calc Af Amer >90 >90 mL/min   Anion gap 4 (L) 5 - 15  Lactate dehydrogenase     Status: None   Collection Time: 03/29/14  2:00 PM  Result Value Ref Range   LDH 203 94 - 250 U/L  Uric acid     Status: Abnormal   Collection Time: 03/29/14  2:00 PM  Result Value Ref Range   Uric Acid, Serum 7.2 (H) 2.4 - 7.0 mg/dL     Physical Exam  Constitutional: She is oriented to person, place, and time. She appears well-developed and well-nourished.  HENT:  Head: Normocephalic and atraumatic.  Eyes: EOM are normal.  Neck: Normal range of motion.  Cardiovascular: Normal rate, regular rhythm and normal heart sounds.   Respiratory: Effort normal and breath sounds normal.  GI: Soft. Normal appearance and bowel sounds are normal. There is no tenderness. There is no CVA tenderness.  Genitourinary:  Deferred  Musculoskeletal: Normal range of motion. She exhibits edema.  Neurological: She is alert and oriented to person, place, and time.  Reflex Scores:      Bicep reflexes are 2+ on the right side and 2+ on the left side. Positive Clonus   FHR: 135 bpm, Mod Var, -Decels, +Accels UC: None graphed or palpated ED Course  Assessment: IUP at 33.6wks Cat I FT Elevated BP Headache Edema  Plan: -Consulted with Dr. Carmela Hurt -PE as above -PIH Labs, PC Ratio, GBS Culture -NST -BMZ now -Percocet for HA -Start IV-saline lock, treat elevated bp with IV labetalol -In to discuss POC with patient and possibility that delivery may be necessary -Patient tearful, questions and  concerns addressed -Will continue to monitor  Follow Up (1600) -PC Ratio return 12.07, UA 7.2 -Dr. Carmela Hurt consulted and advised to admit to L&D  for IOL -Patient updated on POC   [facility], Chesley Veasey [name] CNM, [name] 03/29/2014 2:03 PM

## 2014-03-29 NOTE — H&P (Signed)
Carrie Bullock is a 24 y.o. female, G3P1011 at 33.6 weeks, presenting for PreEclampsia, IUGR, and NonReassuring Antenatal Testing.  Patient sent from office with elevated bp, c/o new onset HA, BPP 6/8, IUGR with EFW of 3lb13oz, and elevated dopplers with no reverse or absent flow.  After lab testing patient PC Ratio noted to be 12.07, UA 7.2, and all other labs normal including negative GBS.  Patient HA relieved with perocet and after consult and review of Dr. Carmela Hurt found it appropriate to admit to antenatal for further observation and MFM/NICU consults in am with completion of steroid course.    There are no active problems to display for this patient.   History of present pregnancy: Patient entered care at 14.4 weeks.   EDC of 05/11/2014 was established by Definite LMP of 08/04/2013.   Anatomy scan:  18.4 weeks, with normal findings and an posterior placenta.   Additional [place] evaluations:     -17wks for Abdominal Trauma: U/S: EFW 6oz +/- 0.88 oz 38%tile cx 3.93 cm cx closed Postrior placenta 3.9 cm from internal os subchorionic bleed 3.7 cm x 0.5 cm x 3.3 cm at the distal edge of the placenta not believed to be due to trauma -19wks Anatomy: Anatomy U/S reviewed: EFW 226g, linear growth, cervix closed and long, UTERINE SEPTUM NOTED. Female gender and appear normal.  -26.4wks: Growth U/S reviewed: EFW 990 grams (48%), will MONITOR BPD (6.19cm 25w1), cervix 3.37cm, appropriate interval growth. -30.4wks: Growth U/S: EFW 1493g (21.4%), BPD 7.54cm ([redacted]w[redacted]d), linear growth, AFI 15.59cm -33.6wks: [place]: VERTEX, NORMAL FLUID, GROWTH 2%, ELEVATED DOPPLERS (NO REVERSAL OF FLOW), BPP 6/8, CX 3.01 CM. Significant prenatal events: Elbowed in abdomen, by FOB, while sleeping.  Patient with c/o abdominal pain throughout pregnancy.  Patient also c/o pressure and pain and swelling in feet.   Last evaluation:  03/29/2014 by Dr. AVS.  0/0/-3, FHR 162, BP 142/102, wt 249lbs  OB History    Gravida Para Term Preterm AB TAB  SAB Ectopic Multiple Living   08/2008: 38 wks M 6lbs 7oz via NSVD with Epidural 2013: TAB  Past Medical History  Diagnosis Date  . Pregnancy induced hypertension   . Headache    History reviewed. No pertinent past surgical history. Family History: family history includes Hypertension in her mother. Social History:  reports that she has been smoking.  She does not have any smokeless tobacco history on file. She reports that she drinks alcohol. She reports that she does not use illicit drugs.   Prenatal Transfer Tool  Maternal Diabetes: No Genetic Screening: None Maternal Ultrasounds/Referrals: Abnormal:  Findings:   Other: Uterine Septum Fetal Ultrasounds or other Referrals:  None Maternal Substance Abuse:  Yes:  Type: Smoker Significant Maternal Medications:  Meds include: Other: Flexeril Significant Maternal Lab Results: Lab values include: Group B Strep negative    ROS:  -Ctx, -Lof, -VB, +FM, +Swelling, -Ha, -Numbness/tingling, -Visual Disturbance, -Epigastric Pain  No Known Allergies     Blood pressure 149/97, pulse 71, temperature 98.2 F (36.8 C), temperature source Oral, resp. rate 20, last menstrual period 08/04/2013, SpO2 100 %, unknown if currently breastfeeding.  Physical Exam  Constitutional: She is oriented to person, place, and time. She appears well-developed and well-nourished.  HENT:  Head: Normocephalic and atraumatic.  Eyes: EOM are normal.  Neck: Normal range of motion.  Cardiovascular: Normal rate, regular rhythm and normal heart sounds.  Respiratory: Effort normal  and breath sounds normal.  GI: Soft. Normal appearance and bowel sounds are normal. There is no tenderness. There is no CVA tenderness.  Genitourinary:  Deferred  Musculoskeletal: Normal range of motion. She exhibits edema.  Neurological: She is alert and oriented to person, place, and time.  Reflex Scores:  Bicep reflexes are 2+ on the right side and 2+ on the  left side. Positive Clonus   FHR: 135 bpm, Mod Var, -Decels, +Accels UC: None graphed or palpated  Prenatal labs: ABO, Rh:  O Positive Antibody:  Negative Rubella:   Immune RPR:   NR HBsAg:   Negative HIV:   Negative GBS:  Negative Sickle cell/Hgb electrophoresis:  Normal Pap:  Unknown GC:  Negative Chlamydia:  Negative Genetic screenings:  None Glucola:  Normal Other:  None    Assessment IUP at 33.6wks Cat I FT PreEclampsia IUGR  Plan: Admit to Antepartum for observation per consult with Dr.E. Sallye OberKulwa Routine Antepartum Orders per [facility] Guideline MFM Consult in AM NICU Consult in AM Monitor TID  [facility], [name], [name] 03/29/2014, 4:23 PM

## 2014-03-29 NOTE — Plan of Care (Signed)
Problem: Consults Goal: Antepartum Patient Education Outcome: Progressing Oriented to antenatal. Patient receptive. Goal: Birthing Suites Patient Information Press F2 to bring up selections list  Outcome: Not Applicable Date Met:  02/33/43 Patient on antenatal

## 2014-03-29 NOTE — MAU Note (Signed)
Pt was seen by Dr. Stefano GaulStringer today after pt called office with pedal edema.  Also stated she has a headache. Denies visual disturbances.  Pt states she is having vaginal pressure and was checked in the office today.  No vaginal bleeding, discharge or ROM.  Good fetal movement.

## 2014-03-30 ENCOUNTER — Ambulatory Visit (HOSPITAL_COMMUNITY): Payer: Medicaid Other

## 2014-03-30 ENCOUNTER — Inpatient Hospital Stay (HOSPITAL_COMMUNITY): Payer: Medicaid Other

## 2014-03-30 LAB — COMPREHENSIVE METABOLIC PANEL
ALK PHOS: 92 U/L (ref 39–117)
ALT: 15 U/L (ref 0–35)
ALT: 21 U/L (ref 0–35)
ANION GAP: 4 — AB (ref 5–15)
AST: 20 U/L (ref 0–37)
AST: 33 U/L (ref 0–37)
Albumin: 2.3 g/dL — ABNORMAL LOW (ref 3.5–5.2)
Albumin: 2.4 g/dL — ABNORMAL LOW (ref 3.5–5.2)
Alkaline Phosphatase: 80 U/L (ref 39–117)
Anion gap: 5 (ref 5–15)
BILIRUBIN TOTAL: 0.3 mg/dL (ref 0.3–1.2)
BUN: 19 mg/dL (ref 6–23)
BUN: 21 mg/dL (ref 6–23)
CALCIUM: 8.9 mg/dL (ref 8.4–10.5)
CHLORIDE: 108 mmol/L (ref 96–112)
CHLORIDE: 109 mmol/L (ref 96–112)
CO2: 20 mmol/L (ref 19–32)
CO2: 18 mmol/L — AB (ref 19–32)
CREATININE: 1 mg/dL (ref 0.50–1.10)
Calcium: 8.7 mg/dL (ref 8.4–10.5)
Creatinine, Ser: 1.14 mg/dL — ABNORMAL HIGH (ref 0.50–1.10)
GFR calc Af Amer: 77 mL/min — ABNORMAL LOW (ref >90)
GFR, EST AFRICAN AMERICAN: 90 mL/min — AB (ref >90)
GFR, EST NON AFRICAN AMERICAN: 78 mL/min — AB (ref >90)
GFR, EST NON AFRICAN AMERICAN: 66 mL/min — AB (ref >90)
GLUCOSE: 170 mg/dL — AB (ref 70–99)
Glucose, Bld: 119 mg/dL — ABNORMAL HIGH (ref 70–99)
POTASSIUM: 4 mmol/L (ref 3.5–5.1)
Potassium: 4.3 mmol/L (ref 3.5–5.1)
SODIUM: 132 mmol/L — AB (ref 135–145)
Sodium: 132 mmol/L — ABNORMAL LOW (ref 135–145)
Total Bilirubin: 0.2 mg/dL — ABNORMAL LOW (ref 0.3–1.2)
Total Protein: 5.8 g/dL — ABNORMAL LOW (ref 6.0–8.3)
Total Protein: 5.8 g/dL — ABNORMAL LOW (ref 6.0–8.3)

## 2014-03-30 LAB — CBC
HCT: 34.7 % — ABNORMAL LOW (ref 36.0–46.0)
HEMATOCRIT: 34.5 % — AB (ref 36.0–46.0)
Hemoglobin: 12.1 g/dL (ref 12.0–15.0)
Hemoglobin: 12.1 g/dL (ref 12.0–15.0)
MCH: 29.7 pg (ref 26.0–34.0)
MCH: 29.8 pg (ref 26.0–34.0)
MCHC: 35.1 g/dL (ref 30.0–36.0)
MCHC: 34.9 g/dL (ref 30.0–36.0)
MCV: 84.8 fL (ref 78.0–100.0)
MCV: 85.5 fL (ref 78.0–100.0)
PLATELETS: 176 10*3/uL (ref 150–400)
Platelets: 205 10*3/uL (ref 150–400)
RBC: 4.07 MIL/uL (ref 3.87–5.11)
RBC: 4.06 MIL/uL (ref 3.87–5.11)
RDW: 13.2 % (ref 11.5–15.5)
RDW: 13.6 % (ref 11.5–15.5)
WBC: 13.6 10*3/uL — AB (ref 4.0–10.5)
WBC: 16.1 10*3/uL — ABNORMAL HIGH (ref 4.0–10.5)

## 2014-03-30 LAB — RPR: RPR Ser Ql: NONREACTIVE

## 2014-03-30 LAB — ABO/RH: ABO/RH(D): O POS

## 2014-03-30 LAB — HIV ANTIBODY (ROUTINE TESTING W REFLEX): HIV Screen 4th Generation wRfx: NONREACTIVE

## 2014-03-30 LAB — LACTATE DEHYDROGENASE
LDH: 203 U/L (ref 94–250)
LDH: 208 U/L (ref 94–250)

## 2014-03-30 LAB — URIC ACID: Uric Acid, Serum: 7.6 mg/dL — ABNORMAL HIGH (ref 2.4–7.0)

## 2014-03-30 MED ORDER — BETAMETHASONE SOD PHOS & ACET 6 (3-3) MG/ML IJ SUSP
12.0000 mg | Freq: Once | INTRAMUSCULAR | Status: AC
  Administered 2014-03-30: 12 mg via INTRAMUSCULAR
  Filled 2014-03-30: qty 2

## 2014-03-30 NOTE — Consult Note (Signed)
Asked by Dr Sallye OberKulwa to speak to Carrie Bullock to discuss outcome of preterm pregnancy at 34 weeks. Chart reviewed. She is 34 0/7 weeks, with preeclampsia. Growth appears appropriate,  EFW is 33%. She has received a dose of betamethasone and is currently on labetalol. She is under close obs for hypertension with possible delivery if with severe hypertension.  I spoke to Carrie Bullock in her room. I discussed outcomes and expectations for 34 wk preterm. I discussed NICU presence at delivery and assessment of resp status.   I discussed admission to NICU for <35 wks, need for temp support, gavage feeding, possible need for  IV fluids and LOS. I  I also discussed breast feeding and benefits especially to a preterm baby.   I answered her questions to her satisfaction.   Thank you for this consult.  I spent 20 minutes with this consult, more than 50% of the time was with face-to-face counseling with Carrie Rubye OaksPalmer.   Lucillie Garfinkelita Q Otis Burress, MD  Neonatologist

## 2014-03-30 NOTE — Consult Note (Addendum)
MFM consult  24 yr old G3P1011 at 4180w0d with preeclampsia referred by Dr. Su Hiltoberts for fetal ultrasound and consult.  Patient was sent to L&D from the clinic yesterday for elevated blood pressure. Patient reports had a headache but has since resolved. No vision changes or abdominal pain. No other complaints and remainder of reviews of symptoms is negative.  Ultrasound today: single intrauterine pregnancy. Estimated fetal weight is in the 33rd%; the head circumference is in the <3rd% (although measurements difficult due to fetal position); abdominal circumference is in the 7th%. Posterior placenta without evidence of previa. Normal amniotic fluid index. The anatomy survey is limited as above; no abnormalities seen. Normal umbilical artery Doppler studies; althoug at top end of normal. Biophysical profile is 6/8 (-2 for breathing).  Recommendations: 1. Appropriate fetal growth overall: - abdominal circumference and head circumference lagging (head measurements difficult to obtain- limited intracranial anatomy appear normal) - normal AFI and Doppler studies - recommend weekly AFI and Doppler studies 2. Preeclampsia: - patient currently meets criteria for preeclampsia without severe features as has had multiple mild range blood pressures and a positive urine protein/creatinine ratio - however has had one severe range blood pressure and if has another severe range blood pressure at this time would meet criteria for preeclampsia with severe features and I would then recommend delivery given >32 weeks - recommend inpatient management for now (given borderline blood pressures and creatinine) with delivery if meets criteria for severe preeclampsia (another severe range blood pressure, symptoms, abnormal labs (LFTs twice normal, platelets <100,000, creatinine 1.2 or higher- previously 1) or meets other criteria would recommend immediate delivery - otherwise delivery at 37 weeks or sooner if clinically  indicated as above or for nonreassuring fetal status/testing - given borderline creatinine would repeat this evening and then at least daily if stable - counseled patient on risks of worsening preeclampsia, eclampsia, abruption, need for preterm delivery - recommend NICU consult - if meets criteria for severe preeclampsia recommend starting magnesium sulfate and continue through 24 hours postpartum and proceeding with induction/delivery 3. Recommend continue antenatal surveillance with testing 4. BPP 6/8 today- discussed with Dr. Su Hiltoberts will need NST; if remains 6/8 recommend continuous monitoring with repeat in 6-24 hours  I spent a total of 40 minutes with the patient of which >50% was in face to face consultation.  Discussed with Dr. Nanda Quintonoberts  Averill Winters, MD

## 2014-03-31 ENCOUNTER — Inpatient Hospital Stay (HOSPITAL_COMMUNITY): Payer: Medicaid Other

## 2014-03-31 ENCOUNTER — Encounter (HOSPITAL_COMMUNITY): Payer: Self-pay

## 2014-03-31 DIAGNOSIS — O1410 Severe pre-eclampsia, unspecified trimester: Secondary | ICD-10-CM

## 2014-03-31 LAB — CBC
HCT: 31.7 % — ABNORMAL LOW (ref 36.0–46.0)
HCT: 33.3 % — ABNORMAL LOW (ref 36.0–46.0)
HEMATOCRIT: 33.7 % — AB (ref 36.0–46.0)
HEMOGLOBIN: 11.6 g/dL — AB (ref 12.0–15.0)
HEMOGLOBIN: 11.8 g/dL — AB (ref 12.0–15.0)
Hemoglobin: 11.1 g/dL — ABNORMAL LOW (ref 12.0–15.0)
MCH: 30 pg (ref 26.0–34.0)
MCH: 30.1 pg (ref 26.0–34.0)
MCH: 30.2 pg (ref 26.0–34.0)
MCHC: 34.8 g/dL (ref 30.0–36.0)
MCHC: 35 g/dL (ref 30.0–36.0)
MCHC: 35 g/dL (ref 30.0–36.0)
MCV: 86.3 fL (ref 78.0–100.0)
MCV: 86.1 fL (ref 78.0–100.0)
MCV: 85.8 fL (ref 78.0–100.0)
Platelets: 171 10*3/uL (ref 150–400)
Platelets: 181 10*3/uL (ref 150–400)
Platelets: 192 10*3/uL (ref 150–400)
RBC: 3.68 MIL/uL — ABNORMAL LOW (ref 3.87–5.11)
RBC: 3.86 MIL/uL — AB (ref 3.87–5.11)
RBC: 3.93 MIL/uL (ref 3.87–5.11)
RDW: 13.7 % (ref 11.5–15.5)
RDW: 13.6 % (ref 11.5–15.5)
RDW: 13.8 % (ref 11.5–15.5)
WBC: 17 10*3/uL — ABNORMAL HIGH (ref 4.0–10.5)
WBC: 16 10*3/uL — AB (ref 4.0–10.5)
WBC: 15.4 10*3/uL — ABNORMAL HIGH (ref 4.0–10.5)

## 2014-03-31 LAB — COMPREHENSIVE METABOLIC PANEL
ALBUMIN: 2.2 g/dL — AB (ref 3.5–5.2)
ALBUMIN: 2.2 g/dL — AB (ref 3.5–5.2)
ALBUMIN: 2.1 g/dL — AB (ref 3.5–5.2)
ALK PHOS: 84 U/L (ref 39–117)
ALT: 22 U/L (ref 0–35)
ALT: 22 U/L (ref 0–35)
ALT: 26 U/L (ref 0–35)
ANION GAP: 3 — AB (ref 5–15)
AST: 29 U/L (ref 0–37)
AST: 30 U/L (ref 0–37)
AST: 34 U/L (ref 0–37)
Alkaline Phosphatase: 81 U/L (ref 39–117)
Alkaline Phosphatase: 85 U/L (ref 39–117)
Anion gap: 3 — ABNORMAL LOW (ref 5–15)
Anion gap: 1 — ABNORMAL LOW (ref 5–15)
BILIRUBIN TOTAL: 0.5 mg/dL (ref 0.3–1.2)
BILIRUBIN TOTAL: 0.2 mg/dL — AB (ref 0.3–1.2)
BUN: 19 mg/dL (ref 6–23)
BUN: 22 mg/dL (ref 6–23)
BUN: 23 mg/dL (ref 6–23)
CALCIUM: 8.7 mg/dL (ref 8.4–10.5)
CALCIUM: 7.4 mg/dL — AB (ref 8.4–10.5)
CHLORIDE: 108 mmol/L (ref 96–112)
CO2: 20 mmol/L (ref 19–32)
CO2: 21 mmol/L (ref 19–32)
CO2: 21 mmol/L (ref 19–32)
CREATININE: 1.29 mg/dL — AB (ref 0.50–1.10)
Calcium: 8.7 mg/dL (ref 8.4–10.5)
Chloride: 109 mmol/L (ref 96–112)
Chloride: 110 mmol/L (ref 96–112)
Creatinine, Ser: 1.04 mg/dL (ref 0.50–1.10)
Creatinine, Ser: 0.96 mg/dL (ref 0.50–1.10)
GFR calc Af Amer: 66 mL/min — ABNORMAL LOW (ref >90)
GFR calc Af Amer: 86 mL/min — ABNORMAL LOW (ref >90)
GFR calc non Af Amer: 74 mL/min — ABNORMAL LOW (ref >90)
GFR calc non Af Amer: 82 mL/min — ABNORMAL LOW (ref >90)
GFR, EST NON AFRICAN AMERICAN: 57 mL/min — AB (ref >90)
GLUCOSE: 98 mg/dL (ref 70–99)
Glucose, Bld: 120 mg/dL — ABNORMAL HIGH (ref 70–99)
Glucose, Bld: 184 mg/dL — ABNORMAL HIGH (ref 70–99)
POTASSIUM: 4.5 mmol/L (ref 3.5–5.1)
Potassium: 4.2 mmol/L (ref 3.5–5.1)
Potassium: 4.2 mmol/L (ref 3.5–5.1)
SODIUM: 132 mmol/L — AB (ref 135–145)
Sodium: 132 mmol/L — ABNORMAL LOW (ref 135–145)
Sodium: 132 mmol/L — ABNORMAL LOW (ref 135–145)
Total Bilirubin: 0.3 mg/dL (ref 0.3–1.2)
Total Protein: 5 g/dL — ABNORMAL LOW (ref 6.0–8.3)
Total Protein: 5.4 g/dL — ABNORMAL LOW (ref 6.0–8.3)
Total Protein: 5.4 g/dL — ABNORMAL LOW (ref 6.0–8.3)

## 2014-03-31 LAB — LACTATE DEHYDROGENASE
LDH: 224 U/L (ref 94–250)
LDH: 201 U/L (ref 94–250)

## 2014-03-31 LAB — MAGNESIUM
Magnesium: 3 mg/dL — ABNORMAL HIGH (ref 1.5–2.5)
Magnesium: 5 mg/dL — ABNORMAL HIGH (ref 1.5–2.5)

## 2014-03-31 LAB — RPR: RPR Ser Ql: NONREACTIVE

## 2014-03-31 LAB — URIC ACID
Uric Acid, Serum: 7.1 mg/dL — ABNORMAL HIGH (ref 2.4–7.0)
Uric Acid, Serum: 7.1 mg/dL — ABNORMAL HIGH (ref 2.4–7.0)

## 2014-03-31 MED ORDER — FENTANYL 2.5 MCG/ML BUPIVACAINE 1/10 % EPIDURAL INFUSION (WH - ANES)
14.0000 mL/h | INTRAMUSCULAR | Status: DC | PRN
  Administered 2014-04-01: 14 mL/h via EPIDURAL
  Filled 2014-03-31: qty 125

## 2014-03-31 MED ORDER — TERBUTALINE SULFATE 1 MG/ML IJ SOLN: 0.2500 mg | Freq: Once | INTRAMUSCULAR | Status: AC | PRN

## 2014-03-31 MED ORDER — EPHEDRINE 5 MG/ML INJ
10.0000 mg | INTRAVENOUS | Status: DC | PRN
  Filled 2014-03-31: qty 2

## 2014-03-31 MED ORDER — NALBUPHINE HCL 10 MG/ML IJ SOLN
10.0000 mg | INTRAMUSCULAR | Status: DC | PRN
  Filled 2014-03-31: qty 1

## 2014-03-31 MED ORDER — OXYTOCIN 40 UNITS IN LACTATED RINGERS INFUSION - SIMPLE MED: 62.5000 mL/h | INTRAVENOUS | Status: DC

## 2014-03-31 MED ORDER — CITRIC ACID-SODIUM CITRATE 334-500 MG/5ML PO SOLN
30.0000 mL | ORAL | Status: DC | PRN
  Filled 2014-03-31: qty 15

## 2014-03-31 MED ORDER — ACETAMINOPHEN 325 MG PO TABS: 650.0000 mg | ORAL_TABLET | ORAL | Status: DC | PRN

## 2014-03-31 MED ORDER — DIPHENHYDRAMINE HCL 50 MG/ML IJ SOLN: 12.5000 mg | INTRAMUSCULAR | Status: DC | PRN

## 2014-03-31 MED ORDER — PHENYLEPHRINE 40 MCG/ML (10ML) SYRINGE FOR IV PUSH (FOR BLOOD PRESSURE SUPPORT)
80.0000 ug | PREFILLED_SYRINGE | INTRAVENOUS | Status: DC | PRN
  Filled 2014-03-31: qty 20
  Filled 2014-03-31: qty 2

## 2014-03-31 MED ORDER — LIDOCAINE HCL (PF) 1 % IJ SOLN
30.0000 mL | INTRAMUSCULAR | Status: DC | PRN
  Filled 2014-03-31: qty 30

## 2014-03-31 MED ORDER — PANTOPRAZOLE SODIUM 40 MG IV SOLR
40.0000 mg | Freq: Every day | INTRAVENOUS | Status: DC
  Administered 2014-03-31 – 2014-04-01 (×3): 40 mg via INTRAVENOUS
  Filled 2014-03-31 (×3): qty 40

## 2014-03-31 MED ORDER — LACTATED RINGERS IV SOLN: 500.0000 mL | INTRAVENOUS | Status: DC | PRN

## 2014-03-31 MED ORDER — PANTOPRAZOLE SODIUM 40 MG IV SOLR: 40.0000 mg | Freq: Every day | INTRAVENOUS | Status: DC

## 2014-03-31 MED ORDER — MAGNESIUM SULFATE 40 G IN LACTATED RINGERS - SIMPLE
1.0000 g/h | INTRAVENOUS | Status: DC
Start: 2014-03-31 — End: 2014-04-01
  Administered 2014-03-31: 2 g/h via INTRAVENOUS
  Administered 2014-04-01: 1 g/h via INTRAVENOUS
  Filled 2014-03-31 (×2): qty 500

## 2014-03-31 MED ORDER — LACTATED RINGERS IV SOLN
500.0000 mL | Freq: Once | INTRAVENOUS | Status: AC
  Administered 2014-04-01: 300 mL via INTRAVENOUS

## 2014-03-31 MED ORDER — MISOPROSTOL 25 MCG QUARTER TABLET: 25.0000 ug | ORAL_TABLET | ORAL | Status: DC

## 2014-03-31 MED ORDER — FLEET ENEMA 7-19 GM/118ML RE ENEM: 1.0000 | ENEMA | RECTAL | Status: DC | PRN

## 2014-03-31 MED ORDER — OXYCODONE-ACETAMINOPHEN 5-325 MG PO TABS: 1.0000 | ORAL_TABLET | ORAL | Status: DC | PRN

## 2014-03-31 MED ORDER — OXYTOCIN BOLUS FROM INFUSION: 500.0000 mL | INTRAVENOUS | Status: DC

## 2014-03-31 MED ORDER — OXYTOCIN 40 UNITS IN LACTATED RINGERS INFUSION - SIMPLE MED
1.0000 m[IU]/min | INTRAVENOUS | Status: DC
  Administered 2014-04-01: 28 m[IU]/min via INTRAVENOUS
  Filled 2014-03-31: qty 1000

## 2014-03-31 MED ORDER — MISOPROSTOL 25 MCG QUARTER TABLET
25.0000 ug | ORAL_TABLET | ORAL | Status: DC | PRN
  Administered 2014-03-31: 25 ug via VAGINAL
  Filled 2014-03-31: qty 0.25
  Filled 2014-03-31: qty 1

## 2014-03-31 MED ORDER — OXYCODONE-ACETAMINOPHEN 5-325 MG PO TABS: 2.0000 | ORAL_TABLET | ORAL | Status: DC | PRN

## 2014-03-31 MED ORDER — MAGNESIUM SULFATE 40 G IN LACTATED RINGERS - SIMPLE
2.0000 g/h | INTRAVENOUS | Status: DC
Start: 2014-03-31 — End: 2014-03-31
  Filled 2014-03-31: qty 500

## 2014-03-31 MED ORDER — LACTATED RINGERS IV SOLN
INTRAVENOUS | Status: DC
  Administered 2014-03-31 (×2): via INTRAVENOUS
  Administered 2014-04-01: 45 mL/h via INTRAVENOUS

## 2014-03-31 MED ORDER — OXYTOCIN 40 UNITS IN LACTATED RINGERS INFUSION - SIMPLE MED
1.0000 m[IU]/min | INTRAVENOUS | Status: DC
  Administered 2014-03-31: 1 m[IU]/min via INTRAVENOUS
  Filled 2014-03-31: qty 1000

## 2014-03-31 MED ORDER — ONDANSETRON HCL 4 MG/2ML IJ SOLN: 4.0000 mg | Freq: Four times a day (QID) | INTRAMUSCULAR | Status: DC | PRN

## 2014-03-31 MED ORDER — PHENYLEPHRINE 40 MCG/ML (10ML) SYRINGE FOR IV PUSH (FOR BLOOD PRESSURE SUPPORT)
80.0000 ug | PREFILLED_SYRINGE | INTRAVENOUS | Status: DC | PRN
Start: 2014-03-31 — End: 2014-04-01
  Filled 2014-03-31: qty 2

## 2014-03-31 NOTE — Progress Notes (Signed)
Labor Progress  Subjective: Very uncomfortable with edema and the mag feeling  Objective: BP 149/91 mmHg  Pulse 72  Temp(Src) 98.3 F (36.8 C) (Oral)  Resp 20  Ht 5\' 6"  (1.676 m)  Wt 243 lb (110.224 kg)  BMI 39.24 kg/m2  SpO2 100%  LMP 08/04/2013 I/O last 3 completed shifts: In: 1560.5 [P.O.:270; I.V.:1290.5] Out: 2310 [Urine:2310] Total I/O In: 554.3 [P.O.:360; I.V.:194.3] Out: 475 [Urine:475] FHT: 125 min with occasional moderate variability, no accel, no decels CTX:  q 2-5 Uterus gravid, soft non tender SVE:  Dilation: 1 Effacement (%): Thick Station: Ballotable Exam by:: V. Fama Muenchow, CNM Pitocin at 8320mUn/min  Assessment:  IUP at 34.1 weeks NICHD: Category Membranes: intact Labor progress: IOL Pitocin Augmentation GBS: negative Blood tinge urine   Plan: Continue labor plan Continuous monitoring Rest Frequent position changes to facilitate fetal rotation and descent. Will reassess with cervical exam at 0100 or earlier if necessary Continue pitocin per protocol PIH labs Mag level    Darby Shadwick, CNM, [name] 03/31/2014. 10:23 PM

## 2014-03-31 NOTE — Progress Notes (Addendum)
Labor Progress  Subjective: Very uncomfortable moving with edema  Objective: BP 145/79 mmHg  Pulse 78  Temp(Src) 98.2 F (36.8 C) (Oral)  Resp 18  Ht 5\' 6"  (1.676 m)  Wt 243 lb (110.224 kg)  BMI 39.24 kg/m2  SpO2 100%  LMP 08/04/2013 I/O last 3 completed shifts: In: 351.8 [P.O.:30; I.V.:321.8] Out: 390 [Urine:390] Total I/O In: 360 [P.O.:60; I.V.:300] Out: 650 [Urine:650] FHT: 120 min variability, no accel, no decels CTX:  regular, every 4-6 minutes Uterus gravid, soft non tender SVE:  Dilation: Fingertip Effacement (%): Thick Station: Ballotable Exam by:: V Mekiah Cambridge, CNM   Assessment:  IUP at 34.1 weeks NICHD: Category 2 Membranes:  intact Labor progress: IOL GBS: negative   Plan: Continue labor plan Continuous monitoring Rest Will reassess with cervical exam at 1400 or earlier if necessary Continue pitocin per protocol      Kameren Baade, CNM, [name] 03/31/2014. 11:43 AM  Pt is not receiving an amnioinfusion.   There are no longer any decelerations and the decreased variability could be because of the magnesium.  Pitocin was started will watch very closely

## 2014-03-31 NOTE — Plan of Care (Signed)
Problem: Consults Goal: Birthing Suites Patient Information Press F2 to bring up selections list Outcome: Completed/Met Date Met:  03/31/14  Pt < [redacted] weeks EGA, Inpatient induction and PIH (Pregnancy induced hypertension)

## 2014-03-31 NOTE — Plan of Care (Signed)
Problem: Phase II Progression Outcomes Goal: Empty bladder prn Outcome: Completed/Met Date Met:  03/31/14 Urethral catheter with urometer

## 2014-03-31 NOTE — Progress Notes (Signed)
Patient complaining of right knee pain . States its very sore. Range of motion performed and elevation of knee. Patient states range of motion helped. Will continue to monitor.

## 2014-03-31 NOTE — Progress Notes (Signed)
Pt wanting to have a bowel movement. Requesting to use bathroom. CNM notified and ordered bedside commode for patient to use since she is very edematous and stiff. Pt refusing bedpan and bedside commode. Insisted on walking to bathroom. Informed patient of risk of falling and encouraged to use bedside commode. Patient stated she did not think she would fall and wanted to try walking to bathroom.  Patient was assisted to bathroom and back to bed with no problems.

## 2014-03-31 NOTE — Progress Notes (Signed)
Labor Progress  Subjective: Feeling mag miserable   Objective: BP 153/87 mmHg  Pulse 73  Temp(Src) 98.1 F (36.7 C) (Oral)  Resp 18  Ht 5\' 6"  (1.676 m)  Wt 243 lb (110.224 kg)  BMI 39.24 kg/m2  SpO2 100%  LMP 08/04/2013 I/O last 3 completed shifts: In: 351.8 [P.O.:30; I.V.:321.8] Out: 390 [Urine:390] Total I/O In: 1030.6 [P.O.:240; I.V.:790.6] Out: 1735 [Urine:1735] FHT:130, min-mod variability, no decel, rare accel CTX:  irregular, every 3-8 minutes Uterus gravid, soft non tender SVE:  Dilation: 1 Effacement (%): Thick Station: Ballotable Exam by:: V. Jayonna Meyering, CNM Pitocin at 3312mUn/min  Assessment:  IUP at 34.1 weeks NICHD: Category 2 Membranes:  intact Labor progress: IOL Pitocin Augmentation GBS: negative   Plan: Continue labor plan Continuous monitoring Frequent position changes to facilitate fetal rotation and descent. Will reassess with cervical exam at 2100 or earlier if necessary Continue pitocin per protocol Increase pitocin x2 q 30 minutes     Tamryn Popko, CNM, [name] 03/31/2014. 6:31 PM

## 2014-03-31 NOTE — Progress Notes (Signed)
Patient ID: Carrie Bullock, female   DOB: [birth year], 24 y.o.   MRN: [id number] Carrie Bullock is a 24 y.o. G3P0011 at 1518w1d admitted for Preeclampsia and IUGR  Subjective: Denies HA, visual changes or abdominal pain.  I discussed diagnosis with patient and informed her of MFM consulation which will give recs on delivery.  Currently pt is preeclamptic but not meeting criteria for severe features although creatinine is elevated.  Questions answered and FOB in room.  Objective: BP 144/90 mmHg  Pulse 71  Temp(Src) 98.3 F (36.8 C) (Oral)  Resp 20  Ht 5\' 6"  (1.676 m)  Wt 110.224 kg (243 lb)  BMI 39.24 kg/m2  SpO2 100%  LMP 08/04/2013 I/O last 3 completed shifts: In: 3 [I.V.:3] Out: -     Physical Exam:  Gen: alert Chest/Lungs: cta bilaterally  Heart/Pulse: RRR  Abdomen: soft, gravid, nontender, BX x4 quad Uterine fundus: soft, nontender Skin & Color: warm and dry  Neurological: AOx3, DTRs 3+ EXT: negative Homan's b/l, edema 2+  FHT:  FHR: 130s bpm, variability: moderate,  accelerations:  Absent,  decelerations:  Absent UC:   none SVE:   deferred  Labs: Lab Results  Component Value Date   WBC 16.0* 03/30/2014   HGB 11.1* 03/30/2014   HCT 31.7* 03/30/2014   MCV 86.1 03/30/2014   PLT 171 03/30/2014    Assessment and Plan: has Preeclampsia; Severe preeclampsia; and Preeclampsia, severe on her problem list. Awaiting MFM consultation with U/S and recs regarding timing of delivery BMZ course to be completed today at 2:15pm Fetal status overall reassuring with cat 1 tracing.  Will continue continuous monitoring.  Shirel Mallis Y 03/31/2014, 3:54 AM

## 2014-03-31 NOTE — Progress Notes (Signed)
Labor Progress  Subjective: Called to the bedside stat at 0848 by the care nurse for late decels.   Dr Normand Sloopillard called to the bedside  Objective: BP 151/74 mmHg  Pulse 74  Temp(Src) 98.2 F (36.8 C) (Oral)  Resp 18  Ht 5\' 6"  (1.676 m)  Wt 243 lb (110.224 kg)  BMI 39.24 kg/m2  SpO2 100%  LMP 08/04/2013 I/O last 3 completed shifts: In: 351.8 [P.O.:30; I.V.:321.8] Out: 390 [Urine:390] Total I/O In: 150 [I.V.:150] Out: 425 [Urine:425] FHT:140, min variability, no accel, late decels to 80 x1-2 minutes with return to baseline. CTX:  regular, every 3-4 minutes Uterus gravid, soft non tender SVE:  Dilation: Fingertip Effacement (%): Thick Station: Ballotable Exam by:: v strandard cnm   Assessment:  IUP at 34.1 weeks NICHD: Category 3 with active intrauterine resuscitative measures  Membranes:  intact Labor progress: IOL ZOX:[id number]:negative Possible CS   Plan: Continue labor plan Continuous monitoring Delay CS at this time, per Dr Maureen Ralphsillard     Carrie Bullock, CNM, [name] 03/31/2014. 9:18 AM

## 2014-03-31 NOTE — Progress Notes (Signed)
Subjective: Denies s/s of preeclampsia. FOB at bedside. IV Protonix helpful.  Objective: BP 148/86 mmHg  Pulse 68  Temp(Src) 98.3 F (36.8 C) (Oral)  Resp 20  Ht  (1.676 m)  Wt 243 lb (110.224 kg)  BMI 39.24 kg/m2  SpO2 100%  LMP 08/04/2013 I/O last 3 completed shifts: In: 3 [I.V.:3] Out: -  Total I/O In: 273.8 [P.O.:30; I.V.:243.8] Out: 265 [Urine:265] Today's Vitals   03/31/14 0321 03/31/14 0414 03/31/14 0500 03/31/14 0600  BP: 144/90 149/96 147/97 148/86  Pulse: 71 72 71 68  Temp:      TempSrc:      Resp:  Height:      Weight:      SpO2:      PainSc:       Results for orders placed or performed during the hospital encounter of 03/29/14 (from the past 24 hour(s))  Comprehensive metabolic panel     Status: Abnormal   Collection Time: 03/30/14  8:33 AM  Result Value Ref Range   Sodium 132 (L) 135 - 145 mmol/L   Potassium 4.3 3.5 - 5.1 mmol/L   Chloride 108 96 - 112 mmol/L   CO2 20 19 - 32 mmol/L   Glucose, Bld 119 (H) 70 - 99 mg/dL   BUN 19 6 - 23 mg/dL   Creatinine, Ser 1.61 0.50 - 1.10 mg/dL   Calcium 8.7 8.4 - 09.6 mg/dL   Total Protein 5.8 (L) 6.0 - 8.3 g/dL   Albumin 2.3 (L) 3.5 - 5.2 g/dL   AST 20 0 - 37 U/L   ALT 15 0 - 35 U/L   Alkaline Phosphatase 80 39 - 117 U/L   Total Bilirubin 0.3 0.3 - 1.2 mg/dL   GFR calc non Af Amer 78 (L) >90 mL/min   GFR calc Af Amer 90 (L) >90 mL/min   Anion gap 4 (L) 5 - 15  Lactate dehydrogenase     Status: None   Collection Time: 03/30/14  8:33 AM  Result Value Ref Range   LDH 203 94 - 250 U/L  CBC     Status: Abnormal   Collection Time: 03/30/14  8:33 AM  Result Value Ref Range   WBC 13.6 (H) 4.0 - 10.5 K/uL   RBC 4.07 3.87 - 5.11 MIL/uL   Hemoglobin 12.1 12.0 - 15.0 g/dL   HCT 04.5 (L) 40.9 - 81.1 %   MCV 84.8 78.0 - 100.0 fL   MCH 29.7 26.0 - 34.0 pg   MCHC 35.1 30.0 - 36.0 g/dL   RDW 91.4 78.2 - 95.6 %   Platelets 176 150 - 400 K/uL  CBC     Status: Abnormal   Collection Time: 03/30/14   6:40 PM  Result Value Ref Range   WBC 16.1 (H) 4.0 - 10.5 K/uL   RBC 4.06 3.87 - 5.11 MIL/uL   Hemoglobin 12.1 12.0 - 15.0 g/dL   HCT 21.3 (L) 08.6 - 57.8 %   MCV 85.5 78.0 - 100.0 fL   MCH 29.8 26.0 - 34.0 pg   MCHC 34.9 30.0 - 36.0 g/dL   RDW 46.9 62.9 - 52.8 %   Platelets 205 150 - 400 K/uL  Comprehensive metabolic panel     Status: Abnormal   Collection Time: 03/30/14  6:40 PM  Result Value Ref Range   Sodium 132 (L) 135 - 145 mmol/L   Potassium 4.0 3.5 - 5.1 mmol/L   Chloride 109 96 - 112 mmol/L  CO2 18 (L) 19 - 32 mmol/L   Glucose, Bld 170 (H) 70 - 99 mg/dL   BUN 21 6 - 23 mg/dL   Creatinine, Ser 1.61 (H) 0.50 - 1.10 mg/dL   Calcium 8.9 8.4 - 09.6 mg/dL   Total Protein 5.8 (L) 6.0 - 8.3 g/dL   Albumin 2.4 (L) 3.5 - 5.2 g/dL   AST 33 0 - 37 U/L   ALT 21 0 - 35 U/L   Alkaline Phosphatase 92 39 - 117 U/L   Total Bilirubin 0.2 (L) 0.3 - 1.2 mg/dL   GFR calc non Af Amer 66 (L) >90 mL/min   GFR calc Af Amer 77 (L) >90 mL/min   Anion gap 5 5 - 15  Lactate dehydrogenase     Status: None   Collection Time: 03/30/14  6:40 PM  Result Value Ref Range   LDH 208 94 - 250 U/L  Uric acid     Status: Abnormal   Collection Time: 03/30/14  6:40 PM  Result Value Ref Range   Uric Acid, Serum 7.6 (H) 2.4 - 7.0 mg/dL  CBC     Status: Abnormal   Collection Time: 03/30/14 11:52 PM  Result Value Ref Range   WBC 16.0 (H) 4.0 - 10.5 K/uL   RBC 3.68 (L) 3.87 - 5.11 MIL/uL   Hemoglobin 11.1 (L) 12.0 - 15.0 g/dL   HCT 04.5 (L) 40.9 - 81.1 %   MCV 86.1 78.0 - 100.0 fL   MCH 30.2 26.0 - 34.0 pg   MCHC 35.0 30.0 - 36.0 g/dL   RDW 91.4 78.2 - 95.6 %   Platelets 171 150 - 400 K/uL  Comprehensive metabolic panel     Status: Abnormal   Collection Time: 03/30/14 11:52 PM  Result Value Ref Range   Sodium 132 (L) 135 - 145 mmol/L   Potassium 4.2 3.5 - 5.1 mmol/L   Chloride 108 96 - 112 mmol/L   CO2 21 19 - 32 mmol/L   Glucose, Bld 184 (H) 70 - 99 mg/dL   BUN 22 6 - 23 mg/dL   Creatinine,  Ser 2.13 (H) 0.50 - 1.10 mg/dL   Calcium 8.7 8.4 - 08.6 mg/dL   Total Protein 5.4 (L) 6.0 - 8.3 g/dL   Albumin 2.2 (L) 3.5 - 5.2 g/dL   AST 30 0 - 37 U/L   ALT 22 0 - 35 U/L   Alkaline Phosphatase 85 39 - 117 U/L   Total Bilirubin 0.2 (L) 0.3 - 1.2 mg/dL   GFR calc non Af Amer 57 (L) >90 mL/min   GFR calc Af Amer 66 (L) >90 mL/min   Anion gap 3 (L) 5 - 15  CBC     Status: Abnormal   Collection Time: 03/31/14  5:25 AM  Result Value Ref Range   WBC 17.0 (H) 4.0 - 10.5 K/uL   RBC 3.86 (L) 3.87 - 5.11 MIL/uL   Hemoglobin 11.6 (L) 12.0 - 15.0 g/dL   HCT 57.8 (L) 46.9 - 62.9 %   MCV 86.3 78.0 - 100.0 fL   MCH 30.1 26.0 - 34.0 pg   MCHC 34.8 30.0 - 36.0 g/dL   RDW 52.8 41.3 - 24.4 %   Platelets 192 150 - 400 K/uL  Comprehensive metabolic panel     Status: Abnormal   Collection Time: 03/31/14  5:25 AM  Result Value Ref Range   Sodium 132 (L) 135 - 145 mmol/L   Potassium 4.5 3.5 - 5.1 mmol/L  Chloride 109 96 - 112 mmol/L   CO2 20 19 - 32 mmol/L   Glucose, Bld 120 (H) 70 - 99 mg/dL   BUN 23 6 - 23 mg/dL   Creatinine, Ser 5.361.04 0.50 - 1.10 mg/dL   Calcium 8.7 8.4 - 64.410.5 mg/dL   Total Protein 5.4 (L) 6.0 - 8.3 g/dL   Albumin 2.2 (L) 3.5 - 5.2 g/dL   AST 29 0 - 37 U/L   ALT 22 0 - 35 U/L   Alkaline Phosphatase 84 39 - 117 U/L   Total Bilirubin 0.5 0.3 - 1.2 mg/dL   GFR calc non Af Amer 74 (L) >90 mL/min   GFR calc Af Amer 86 (L) >90 mL/min   Anion gap 3 (L) 5 - 15  Lactate dehydrogenase     Status: None   Collection Time: 03/31/14  5:25 AM  Result Value Ref Range   LDH 224 94 - 250 U/L  Uric acid     Status: Abnormal   Collection Time: 03/31/14  5:25 AM  Result Value Ref Range   Uric Acid, Serum 7.1 (H) 2.4 - 7.0 mg/dL  Magnesium     Status: Abnormal   Collection Time: 03/31/14  5:25 AM  Result Value Ref Range   Magnesium 3.0 (H) 1.5 - 2.5 mg/dL    FHT: BL 034130 w/ minimum variability, no accels, no decels UC:   none SVE: Deferred AM labs as above  Assessment:  IUP at  34.1 wks IOL due to preeclampsia w/ severe features Overall reassuring FHRT  Plan: Continue w/ current plan (Magnesium Sulfate, strict I&O, Cytotec)   Alexis Mizuno CNM 03/31/2014, 6:22 AM

## 2014-03-31 NOTE — Progress Notes (Signed)
Labor Progress  Subjective:   Objective: BP 154/91 mmHg  Pulse 78  Temp(Src) 98.1 F (36.7 C) (Oral)  Resp 18  Ht 5\' 6"  (1.676 m)  Wt 243 lb (110.224 kg)  BMI 39.24 kg/m2  SpO2 100%  LMP 08/04/2013 I/O last 3 completed shifts: In: 351.8 [P.O.:30; I.V.:321.8] Out: 390 [Urine:390] Total I/O In: 942.7 [P.O.:240; I.V.:702.7] Out: 1225 [Urine:1225] FHT: 130, min -mod variability, occasional accel, no decels CTX:  regular, every 3-6 minutes Uterus gravid, soft non tender SVE:  Dilation: Fingertip Effacement (%): Thick Station: Ballotable Exam by:: V Carolena Fairbank, CNM Pitocin at 628mUn/min  Assessment:  IUP at 34.1 weeks NICHD: Category  2 Membranes:  intact Labor progress: IOL Pitocin Augmentation GBS: negative   Plan: Continue labor plan Continuous monitoring Frequent position changes to facilitate fetal rotation and descent. Will reassess with cervical exam at 1800 or earlier if necessary Continue pitocin per protocol      Ambrosio Reuter, CNM, [name] 03/31/2014. 4:23 PM

## 2014-03-31 NOTE — Progress Notes (Signed)
Subjective: CBC and CMP repeated at midnight, specifically to follow serum creatinine. Was 1.14 at 20:40 PM, now 1.29.  Denies h/a, visual disturbances, RUQ pain, N/V, SOB, CP, weakness, LOF, or ctxs. Reports active fetus and indigestion. Reports adequate urinary output.  Objective: BP 144/90 mmHg  Pulse 71  Temp(Src) 98.3 F (36.8 C) (Oral)  Resp 20  Ht 5\' 6"  (1.676 m)  Wt 243 lb (110.224 kg)  BMI 39.24 kg/m2  SpO2 100%  LMP 08/04/2013 I/O last 3 completed shifts: In: 3 [I.V.:3] Out: -    Today's Vitals   03/31/14 0241 03/31/14 0244 03/31/14 0256 03/31/14 0321  BP: 185/111 170/107  144/90  Pulse: 69 65  71  Temp:   98.3 F (36.8 C)   TempSrc:   Oral   Resp:   20   Height:      Weight:      SpO2:      PainSc:       Gen: Mild distress Lungs: CTAB CV: RRR w/o murmur Abdomen: gravid, soft, NT Ext: 2+ DTRs bilaterally, no clonus. 3+ pitting edema, palpable pedal pulses FHT: BL 130 w/ minimal variability, no accels, occ sharp variable w/ quick recovery UC:   none SVE:   Dilation: Closed Effacement (%): Thick Station: Ballotable Exam by:: Thornell MuleK. Talha Iser, CNM Results for orders placed or performed during the hospital encounter of 03/29/14 (from the past 24 hour(s))  Comprehensive metabolic panel     Status: Abnormal   Collection Time: 03/30/14  8:33 AM  Result Value Ref Range   Sodium 132 (L) 135 - 145 mmol/L   Potassium 4.3 3.5 - 5.1 mmol/L   Chloride 108 96 - 112 mmol/L   CO2 20 19 - 32 mmol/L   Glucose, Bld 119 (H) 70 - 99 mg/dL   BUN 19 6 - 23 mg/dL   Creatinine, Ser 0.981.00 0.50 - 1.10 mg/dL   Calcium 8.7 8.4 - 11.910.5 mg/dL   Total Protein 5.8 (L) 6.0 - 8.3 g/dL   Albumin 2.3 (L) 3.5 - 5.2 g/dL   AST 20 0 - 37 U/L   ALT 15 0 - 35 U/L   Alkaline Phosphatase 80 39 - 117 U/L   Total Bilirubin 0.3 0.3 - 1.2 mg/dL   GFR calc non Af Amer 78 (L) >90 mL/min   GFR calc Af Amer 90 (L) >90 mL/min   Anion gap 4 (L) 5 - 15  Lactate dehydrogenase     Status: None    Collection Time: 03/30/14  8:33 AM  Result Value Ref Range   LDH 203 94 - 250 U/L  CBC     Status: Abnormal   Collection Time: 03/30/14  8:33 AM  Result Value Ref Range   WBC 13.6 (H) 4.0 - 10.5 K/uL   RBC 4.07 3.87 - 5.11 MIL/uL   Hemoglobin 12.1 12.0 - 15.0 g/dL   HCT 14.734.5 (L) 82.936.0 - 56.246.0 %   MCV 84.8 78.0 - 100.0 fL   MCH 29.7 26.0 - 34.0 pg   MCHC 35.1 30.0 - 36.0 g/dL   RDW 13.013.2 86.511.5 - 78.415.5 %   Platelets 176 150 - 400 K/uL  CBC     Status: Abnormal   Collection Time: 03/30/14  6:40 PM  Result Value Ref Range   WBC 16.1 (H) 4.0 - 10.5 K/uL   RBC 4.06 3.87 - 5.11 MIL/uL   Hemoglobin 12.1 12.0 - 15.0 g/dL   HCT 69.634.7 (L) 29.536.0 - 28.446.0 %   MCV 85.5  78.0 - 100.0 fL   MCH 29.8 26.0 - 34.0 pg   MCHC 34.9 30.0 - 36.0 g/dL   RDW 16.1 09.6 - 04.5 %   Platelets 205 150 - 400 K/uL  Comprehensive metabolic panel     Status: Abnormal   Collection Time: 03/30/14  6:40 PM  Result Value Ref Range   Sodium 132 (L) 135 - 145 mmol/L   Potassium 4.0 3.5 - 5.1 mmol/L   Chloride 109 96 - 112 mmol/L   CO2 18 (L) 19 - 32 mmol/L   Glucose, Bld 170 (H) 70 - 99 mg/dL   BUN 21 6 - 23 mg/dL   Creatinine, Ser 4.09 (H) 0.50 - 1.10 mg/dL   Calcium 8.9 8.4 - 81.1 mg/dL   Total Protein 5.8 (L) 6.0 - 8.3 g/dL   Albumin 2.4 (L) 3.5 - 5.2 g/dL   AST 33 0 - 37 U/L   ALT 21 0 - 35 U/L   Alkaline Phosphatase 92 39 - 117 U/L   Total Bilirubin 0.2 (L) 0.3 - 1.2 mg/dL   GFR calc non Af Amer 66 (L) >90 mL/min   GFR calc Af Amer 77 (L) >90 mL/min   Anion gap 5 5 - 15  Lactate dehydrogenase     Status: None   Collection Time: 03/30/14  6:40 PM  Result Value Ref Range   LDH 208 94 - 250 U/L  Uric acid     Status: Abnormal   Collection Time: 03/30/14  6:40 PM  Result Value Ref Range   Uric Acid, Serum 7.6 (H) 2.4 - 7.0 mg/dL  CBC     Status: Abnormal   Collection Time: 03/30/14 11:52 PM  Result Value Ref Range   WBC 16.0 (H) 4.0 - 10.5 K/uL   RBC 3.68 (L) 3.87 - 5.11 MIL/uL   Hemoglobin 11.1 (L) 12.0 -  15.0 g/dL   HCT 91.4 (L) 78.2 - 95.6 %   MCV 86.1 78.0 - 100.0 fL   MCH 30.2 26.0 - 34.0 pg   MCHC 35.0 30.0 - 36.0 g/dL   RDW 21.3 08.6 - 57.8 %   Platelets 171 150 - 400 K/uL  Comprehensive metabolic panel     Status: Abnormal   Collection Time: 03/30/14 11:52 PM  Result Value Ref Range   Sodium 132 (L) 135 - 145 mmol/L   Potassium 4.2 3.5 - 5.1 mmol/L   Chloride 108 96 - 112 mmol/L   CO2 21 19 - 32 mmol/L   Glucose, Bld 184 (H) 70 - 99 mg/dL   BUN 22 6 - 23 mg/dL   Creatinine, Ser 4.69 (H) 0.50 - 1.10 mg/dL   Calcium 8.7 8.4 - 62.9 mg/dL   Total Protein 5.4 (L) 6.0 - 8.3 g/dL   Albumin 2.2 (L) 3.5 - 5.2 g/dL   AST 30 0 - 37 U/L   ALT 22 0 - 35 U/L   Alkaline Phosphatase 85 39 - 117 U/L   Total Bilirubin 0.2 (L) 0.3 - 1.2 mg/dL   GFR calc non Af Amer 57 (L) >90 mL/min   GFR calc Af Amer 66 (L) >90 mL/min   Anion gap 3 (L) 5 - 15   Ultrasound today: single intrauterine pregnancy. Estimated fetal weight is in the 33rd%; the head circumference is in the <3rd% (although measurements difficult due to fetal position); abdominal circumference is in the 7th%. Posterior placenta without evidence of previa. Normal amniotic fluid index. The anatomy survey is limited as above; no abnormalities  seen. Normal umbilical artery Doppler studies; althoug at top end of normal. Biophysical profile is 6/8 (-2 for breathing).  1st Cytotec placed vaginally at 03:27 AM  Assessment:  IUP at 34.1 wks IOL for preeclampsia, now with severe features GBS neg No documented uop over previous 24 hrs Unfavorable cvx Steroid complete Cat 1 FHRT overall  Plan: Per MFM: Recommendations: 1. Appropriate fetal growth overall: - abdominal circumference and head circumference lagging (head measurements difficult to obtain- limited intracranial anatomy appear normal) - normal AFI and Doppler studies - recommend weekly AFI and Doppler studies 2. Preeclampsia: - patient currently meets criteria for preeclampsia  without severe features as has had multiple mild range blood pressures and a positive urine protein/creatinine ratio - however has had one severe range blood pressure and if has another severe range blood pressure at this time would meet criteria for preeclampsia with severe features and I would then recommend delivery given >32 weeks - recommend inpatient management for now (given borderline blood pressures and creatinine) with delivery if meets criteria for severe preeclampsia (another severe range blood pressure, symptoms, abnormal labs (LFTs twice normal, platelets <100,000, creatinine 1.2 or higher- previously 1) or meets other criteria would recommend immediate delivery - otherwise delivery at 37 weeks or sooner if clinically indicated as above or for nonreassuring fetal status/testing - given borderline creatinine would repeat this evening and then at least daily if stable - counseled patient on risks of worsening preeclampsia, eclampsia, abruption, need for preterm delivery - recommend NICU consult - if meets criteria for severe preeclampsia recommend starting magnesium sulfate and continue through 24 hours postpartum and proceeding with induction/delivery 3. Recommend continue antenatal surveillance with testing 4. BPP 6/8 today- discussed with Dr. Su Hilt will need NST; if remains 6/8 recommend continuous monitoring with repeat in 6-24 hours  Transfer from antenatal to [facility] to begin IOL based on above MFM recs. Dr. Su Hilt in house to discuss plan with pt and partner. Routine [facility] orders. Magnesium Sulfate 2 grams/hr.  Labetalol parameters (10 mg given at 02:56 AM). Will administer Hydralazine if BPs become unresponsive to Labetalol.  Foley cath w/ strict I&Os. Cytotec 25 mcg per vagina q 4 hrs. Protonix IV qhs prn. Epidural/pain med prn. Preeclampsia labs and Magnesium level at 5 a.m.     Sherre Scarlet CNM 03/31/2014, 3:44 AM

## 2014-04-01 ENCOUNTER — Encounter (HOSPITAL_COMMUNITY): Payer: Self-pay | Admitting: *Deleted

## 2014-04-01 ENCOUNTER — Inpatient Hospital Stay (HOSPITAL_COMMUNITY): Payer: Medicaid Other | Admitting: Anesthesiology

## 2014-04-01 ENCOUNTER — Inpatient Hospital Stay (HOSPITAL_COMMUNITY): Payer: Medicaid Other

## 2014-04-01 DIAGNOSIS — IMO0002 Reserved for concepts with insufficient information to code with codable children: Secondary | ICD-10-CM | POA: Insufficient documentation

## 2014-04-01 DIAGNOSIS — Z3A34 34 weeks gestation of pregnancy: Secondary | ICD-10-CM | POA: Insufficient documentation

## 2014-04-01 LAB — COMPREHENSIVE METABOLIC PANEL
ALK PHOS: 76 U/L (ref 39–117)
ALT: 20 U/L (ref 0–35)
AST: 24 U/L (ref 0–37)
Albumin: 1.9 g/dL — ABNORMAL LOW (ref 3.5–5.2)
Anion gap: 4 — ABNORMAL LOW (ref 5–15)
BUN: 19 mg/dL (ref 6–23)
CALCIUM: 7 mg/dL — AB (ref 8.4–10.5)
CO2: 20 mmol/L (ref 19–32)
Chloride: 110 mmol/L (ref 96–112)
Creatinine, Ser: 0.89 mg/dL (ref 0.50–1.10)
GFR calc Af Amer: >90 mL/min (ref >90)
GFR calc non Af Amer: 89 mL/min — ABNORMAL LOW (ref >90)
GLUCOSE: 97 mg/dL (ref 70–99)
Potassium: 4.3 mmol/L (ref 3.5–5.1)
Sodium: 134 mmol/L — ABNORMAL LOW (ref 135–145)
Total Bilirubin: 0.3 mg/dL (ref 0.3–1.2)
Total Protein: 4.7 g/dL — ABNORMAL LOW (ref 6.0–8.3)

## 2014-04-01 LAB — CBC
HCT: 37.3 % (ref 36.0–46.0)
HEMATOCRIT: 32.5 % — AB (ref 36.0–46.0)
Hemoglobin: 11.5 g/dL — ABNORMAL LOW (ref 12.0–15.0)
Hemoglobin: 12.9 g/dL (ref 12.0–15.0)
MCH: 30.4 pg (ref 26.0–34.0)
MCH: 29.8 pg (ref 26.0–34.0)
MCHC: 35.4 g/dL (ref 30.0–36.0)
MCHC: 34.6 g/dL (ref 30.0–36.0)
MCV: 86 fL (ref 78.0–100.0)
MCV: 86.1 fL (ref 78.0–100.0)
Platelets: 185 10*3/uL (ref 150–400)
Platelets: 203 10*3/uL (ref 150–400)
RBC: 3.78 MIL/uL — AB (ref 3.87–5.11)
RBC: 4.33 MIL/uL (ref 3.87–5.11)
RDW: 13.7 % (ref 11.5–15.5)
RDW: 13.6 % (ref 11.5–15.5)
WBC: 13.5 10*3/uL — AB (ref 4.0–10.5)
WBC: 14.8 10*3/uL — AB (ref 4.0–10.5)

## 2014-04-01 LAB — PROTEIN / CREATININE RATIO, URINE
Creatinine, Urine: 81 mg/dL
PROTEIN CREATININE RATIO: 12.63 — AB (ref 0.00–0.15)
Total Protein, Urine: 1023 mg/dL

## 2014-04-01 LAB — LACTATE DEHYDROGENASE: LDH: 193 U/L (ref 94–250)

## 2014-04-01 LAB — MAGNESIUM: MAGNESIUM: 4.7 mg/dL — AB (ref 1.5–2.5)

## 2014-04-01 LAB — URIC ACID: URIC ACID, SERUM: 7.2 mg/dL — AB (ref 2.4–7.0)

## 2014-04-01 MED ORDER — PRENATAL MULTIVITAMIN CH
1.0000 | ORAL_TABLET | Freq: Every day | ORAL | Status: DC
  Administered 2014-04-02 – 2014-04-03 (×2): 1 via ORAL
  Filled 2014-04-01 (×2): qty 1

## 2014-04-01 MED ORDER — SIMETHICONE 80 MG PO CHEW: 80.0000 mg | CHEWABLE_TABLET | ORAL | Status: DC | PRN

## 2014-04-01 MED ORDER — FENTANYL 2.5 MCG/ML BUPIVACAINE 1/10 % EPIDURAL INFUSION (WH - ANES): 14.0000 mL/h | INTRAMUSCULAR | Status: DC | PRN

## 2014-04-01 MED ORDER — BENZOCAINE-MENTHOL 20-0.5 % EX AERO
1.0000 "application " | INHALATION_SPRAY | CUTANEOUS | Status: DC | PRN
  Filled 2014-04-01: qty 56

## 2014-04-01 MED ORDER — OXYCODONE-ACETAMINOPHEN 5-325 MG PO TABS: 1.0000 | ORAL_TABLET | ORAL | Status: DC | PRN

## 2014-04-01 MED ORDER — IBUPROFEN 600 MG PO TABS
600.0000 mg | ORAL_TABLET | Freq: Four times a day (QID) | ORAL | Status: DC
Start: 2014-04-01 — End: 2014-04-01

## 2014-04-01 MED ORDER — ONDANSETRON HCL 4 MG/2ML IJ SOLN: 4.0000 mg | INTRAMUSCULAR | Status: DC | PRN

## 2014-04-01 MED ORDER — LANOLIN HYDROUS EX OINT: TOPICAL_OINTMENT | CUTANEOUS | Status: DC | PRN

## 2014-04-01 MED ORDER — MAGNESIUM SULFATE 40 G IN LACTATED RINGERS - SIMPLE
2.0000 g/h | INTRAVENOUS | Status: AC
  Filled 2014-04-01: qty 500

## 2014-04-01 MED ORDER — SENNOSIDES-DOCUSATE SODIUM 8.6-50 MG PO TABS
2.0000 | ORAL_TABLET | ORAL | Status: DC
  Administered 2014-04-01 – 2014-04-02 (×2): 2 via ORAL
  Filled 2014-04-01 (×2): qty 2

## 2014-04-01 MED ORDER — WITCH HAZEL-GLYCERIN EX PADS: 1.0000 "application " | MEDICATED_PAD | CUTANEOUS | Status: DC | PRN

## 2014-04-01 MED ORDER — OXYCODONE-ACETAMINOPHEN 5-325 MG PO TABS: 2.0000 | ORAL_TABLET | ORAL | Status: DC | PRN

## 2014-04-01 MED ORDER — DIBUCAINE 1 % RE OINT
1.0000 "application " | TOPICAL_OINTMENT | RECTAL | Status: DC | PRN
  Filled 2014-04-01: qty 28

## 2014-04-01 MED ORDER — ONDANSETRON HCL 4 MG PO TABS: 4.0000 mg | ORAL_TABLET | ORAL | Status: DC | PRN

## 2014-04-01 MED ORDER — PRENATAL MULTIVITAMIN CH: 1.0000 | ORAL_TABLET | Freq: Every day | ORAL | Status: DC

## 2014-04-01 MED ORDER — TETANUS-DIPHTH-ACELL PERTUSSIS 5-2.5-18.5 LF-MCG/0.5 IM SUSP
0.5000 mL | Freq: Once | INTRAMUSCULAR | Status: AC
  Administered 2014-04-02: 0.5 mL via INTRAMUSCULAR
  Filled 2014-04-01 (×2): qty 0.5

## 2014-04-01 MED ORDER — DIPHENHYDRAMINE HCL 25 MG PO CAPS: 25.0000 mg | ORAL_CAPSULE | Freq: Four times a day (QID) | ORAL | Status: DC | PRN

## 2014-04-01 MED ORDER — SENNOSIDES-DOCUSATE SODIUM 8.6-50 MG PO TABS: 2.0000 | ORAL_TABLET | ORAL | Status: DC

## 2014-04-01 MED ORDER — TETANUS-DIPHTH-ACELL PERTUSSIS 5-2.5-18.5 LF-MCG/0.5 IM SUSP
0.5000 mL | Freq: Once | INTRAMUSCULAR | Status: DC
  Filled 2014-04-01: qty 0.5

## 2014-04-01 MED ORDER — ZOLPIDEM TARTRATE 5 MG PO TABS: 5.0000 mg | ORAL_TABLET | Freq: Every evening | ORAL | Status: DC | PRN

## 2014-04-01 MED ORDER — ZOLPIDEM TARTRATE 5 MG PO TABS
5.0000 mg | ORAL_TABLET | Freq: Every evening | ORAL | Status: DC | PRN
  Administered 2014-04-01 – 2014-04-03 (×2): 5 mg via ORAL
  Filled 2014-04-01 (×2): qty 1

## 2014-04-01 MED ORDER — LIDOCAINE HCL (PF) 1 % IJ SOLN
INTRAMUSCULAR | Status: DC | PRN
  Administered 2014-04-01: 5 mL
  Administered 2014-04-01: 2 mL
  Administered 2014-04-01: 7 mL

## 2014-04-01 MED ORDER — IBUPROFEN 600 MG PO TABS
600.0000 mg | ORAL_TABLET | Freq: Four times a day (QID) | ORAL | Status: DC
  Administered 2014-04-01 – 2014-04-03 (×7): 600 mg via ORAL
  Filled 2014-04-01 (×7): qty 1

## 2014-04-01 MED ORDER — OXYCODONE-ACETAMINOPHEN 5-325 MG PO TABS
1.0000 | ORAL_TABLET | ORAL | Status: DC | PRN
  Administered 2014-04-01: 1 via ORAL
  Filled 2014-04-01: qty 1

## 2014-04-01 NOTE — Progress Notes (Signed)
Labor Progress  Subjective: Unable to tolerate VE. C/o right  Hip pain  Objective: BP 142/84 mmHg  Pulse 59  Temp(Src) 98.1 F (36.7 C) (Oral)  Resp 20  Ht 5\' 6"  (1.676 m)  Wt 243 lb (110.224 kg)  BMI 39.24 kg/m2  SpO2 100%  LMP 08/04/2013 I/O last 3 completed shifts: In: 1560.5 [P.O.:270; I.V.:1290.5] Out: 2310 [Urine:2310] Total I/O In: 1303.9 [P.O.:480; I.V.:823.9] Out: 1247 [Urine:1247] FHT: 120 min variability, occasional accel, no decel CTX:  occasional Uterus gravid, soft non tender SVE:  Dilation: 1.5 Effacement (%): Thick Station: -3 Exam by:: V. Love Milbourne CNM   Assessment:  IUP at 34.2 weeks NICHD: Category 2 Membranes:  SROM 0400 x 2.5hrs, no s/s of infection Labor progress: IOL ZOX:[id number]:negative + 3 edema in LE    Plan: Continue labor plan Continuous monitoring Frequent position changes to facilitate fetal rotation and descent. Restart pitocin per protocol at 6:30      Rilya Longo, CNM, [name] 04/01/2014. 6:00 AM

## 2014-04-01 NOTE — Progress Notes (Signed)
V. Standard, CNM at bedside to attempt foley bulb placement. Patient was not compliant with CNM checking her cervix, and therefore CNM was unable to attempt to place foley bulb. Patient complains of discomfort in vagina, and right outer thigh/hip area (pt states "it feels like my skin is going to rip"). Due to patient's noncompliance, plan of care changed, CNM discussed with provider on call, Dr Dion BodyVarnado. New plan: turn off pitocin & rest. Will re-evaluate.

## 2014-04-01 NOTE — Progress Notes (Signed)
Carrie DriversSierra C Coalson MRN: [id number]  Subjective: -Care Assumed of 24 y.o. G3P0 at 34.2wks with PreEclampsia.  Patient in bed, reports discomfort in perineal area r/t swelling.  Some perception of occasional contraction.    Objective: BP 155/74 mmHg  Pulse 61  Temp(Src) 98.1 F (36.7 C) (Oral)  Resp 18  Ht 5\' 6"  (1.676 m)  Wt 243 lb (110.224 kg)  BMI 39.24 kg/m2  SpO2 100%  LMP 08/04/2013 I/O last 3 completed shifts: In: 3015.4 [P.O.:750; I.V.:2265.4] Out: 3692 [Urine:3692]   FHT: 125 bpm, Mod Var, -Decels, +Accels UC: Occasional SVE:   Deferred Membranes: SROM at 0400 Pitocin: 463mUn/min  Results for orders placed or performed during the hospital encounter of 03/29/14 (from the past 24 hour(s))  Protein / creatinine ratio, urine     Status: Abnormal   Collection Time: 03/31/14 10:25 PM  Result Value Ref Range   Creatinine, Urine 81.00 mg/dL   Total Protein, Urine 1023 mg/dL   Protein Creatinine Ratio 12.63 (H) 0.00 - 0.15  CBC     Status: Abnormal   Collection Time: 03/31/14 10:35 PM  Result Value Ref Range   WBC 15.4 (H) 4.0 - 10.5 K/uL   RBC 3.93 3.87 - 5.11 MIL/uL   Hemoglobin 11.8 (L) 12.0 - 15.0 g/dL   HCT 62.133.7 (L) 30.836.0 - 65.746.0 %   MCV 85.8 78.0 - 100.0 fL   MCH 30.0 26.0 - 34.0 pg   MCHC 35.0 30.0 - 36.0 g/dL   RDW 84.613.8 96.211.5 - 95.215.5 %   Platelets 181 150 - 400 K/uL  Lactate dehydrogenase     Status: None   Collection Time: 03/31/14 10:35 PM  Result Value Ref Range   LDH 201 94 - 250 U/L  Uric acid     Status: Abnormal   Collection Time: 03/31/14 10:35 PM  Result Value Ref Range   Uric Acid, Serum 7.1 (H) 2.4 - 7.0 mg/dL  Comprehensive metabolic panel     Status: Abnormal   Collection Time: 03/31/14 10:35 PM  Result Value Ref Range   Sodium 132 (L) 135 - 145 mmol/L   Potassium 4.2 3.5 - 5.1 mmol/L   Chloride 110 96 - 112 mmol/L   CO2 21 19 - 32 mmol/L   Glucose, Bld 98 70 - 99 mg/dL   BUN 19 6 - 23 mg/dL   Creatinine, Ser 8.410.96 0.50 - 1.10 mg/dL   Calcium  7.4 (L) 8.4 - 10.5 mg/dL   Total Protein 5.0 (L) 6.0 - 8.3 g/dL   Albumin 2.1 (L) 3.5 - 5.2 g/dL   AST 34 0 - 37 U/L   ALT 26 0 - 35 U/L   Alkaline Phosphatase 81 39 - 117 U/L   Total Bilirubin 0.3 0.3 - 1.2 mg/dL   GFR calc non Af Amer 82 (L) >90 mL/min   GFR calc Af Amer >90 >90 mL/min   Anion gap 1 (L) 5 - 15  Magnesium     Status: Abnormal   Collection Time: 03/31/14 10:35 PM  Result Value Ref Range   Magnesium 5.0 (H) 1.5 - 2.5 mg/dL  CBC     Status: Abnormal   Collection Time: 04/01/14  7:55 AM  Result Value Ref Range   WBC 13.5 (H) 4.0 - 10.5 K/uL   RBC 3.78 (L) 3.87 - 5.11 MIL/uL   Hemoglobin 11.5 (L) 12.0 - 15.0 g/dL   HCT 32.432.5 (L) 40.136.0 - 02.746.0 %   MCV 86.0 78.0 - 100.0 fL   MCH  30.4 26.0 - 34.0 pg   MCHC 35.4 30.0 - 36.0 g/dL   RDW 40.9 81.1 - 91.4 %   Platelets 185 150 - 400 K/uL     Assessment:  IUP at 34.2wks Cat I FT  PreEclampsia IOL  Plan: -Questions and concerns addressed -Reports some anxiety regarding vaginal exams, reassurances given -Will continue to monitor -Continue other mgmt as ordered  Kaedin Hicklin [name],[name], CNM 04/01/2014, 8:09 AM

## 2014-04-01 NOTE — Progress Notes (Addendum)
In to assess pt.  SROM overnight.  No complaints.  Denies headache, SOB, RUQ pain. VS  BPs 140s-150s/80-90s uop 25/35/100 Gen:  NAD, AxO x3, pt grossly edematous, +periorbital edema Lungs:  CTA bilaterally Neuro:  2+ DTR. EM:  Increased variability but still decreased, some accels overnight.  Occasional contraction  Limited bedside ultrasound-vertex presentation.  IUP at 34 2/7 weeks. Severe Preeclampsia Elevated creatinine but improved since admission.  UOP decreasing. Magnesium sulfate decreased to 1 gram overnight due to decreased UOP.  Magnesium level 5.  No current s/sxs of magnesium toxicity.  Restart Pitocin. If variable decelerations recur with contractions, proceed with cesarean section. Observe UOP closely.  Discontinue Magnesium sulfate if UOP is inadequate. Repeat labs now. IV Labetalol for severe range BPs. POC d/w CNM Gerrit HeckJessica Emly.

## 2014-04-01 NOTE — Progress Notes (Signed)
Labor Progress  Subjective: No change  Objective: BP 150/93 mmHg  Pulse 59  Temp(Src) 98.1 F (36.7 C) (Oral)  Resp 20  Ht 5\' 6"  (1.676 m)  Wt 243 lb (110.224 kg)  BMI 39.24 kg/m2  SpO2 100%  LMP 08/04/2013 I/O last 3 completed shifts: In: 1560.5 [P.O.:270; I.V.:1290.5] Out: 2310 [Urine:2310] Total I/O In: 1032.9 [P.O.:360; I.V.:672.9] Out: 1116 [Urine:1116]   FHT: 125 none - minimum variability, no decel, no accel CTX:  irregular, every 2-8 minutes w/ccasional coupling Uterus gravid, soft non tender SVE:  Dilation: 1 Effacement (%): Thick Station: Ballotable Exam by:: V. Vanassa Penniman, CNM Pitocin at 7430mUn/min  Assessment:  IUP at 34.2 weeks NICHD: Category 2 Membranes: intact Labor progress: IOL Pitocin Augmentation GBS: negative Unsuccessful attempt at placing the foley balloon, pt report increase pain   Plan: Continue labor plan Continuous monitoring DC pitocin Frequent position changes to facilitate fetal rotation and descent. Will reassess with cervical exam at 0600 or earlier if necessary    Lyndi Holbein, CNM, [name] 04/01/2014. 3:38 AM

## 2014-04-01 NOTE — Anesthesia Procedure Notes (Signed)

## 2014-04-01 NOTE — Progress Notes (Signed)
In room to assess with CNM. Cervix 3-4 cm /50%  Copious fluid. Labial edema, moderate.  EM:  Decreased variability but some accels, + fetal scalp stimulation. Contractions irregular. Pt appears uncomfortable with exam.  Proceed with epidural. Pitocin 12 mUs.  Increase prn.

## 2014-04-01 NOTE — Anesthesia Preprocedure Evaluation (Signed)
Anesthesia Evaluation  Patient identified by MRN, date of birth, ID band Patient awake    Reviewed: Allergy & Precautions, H&P , Patient's Chart, lab work & pertinent test results  Airway Mallampati: II  TM Distance: >3 FB Neck ROM: full    Dental  (+) Teeth Intact   Pulmonary former smoker,    breath sounds clear to auscultation       Cardiovascular hypertension,  Rhythm:regular Rate:Normal     Neuro/Psych    GI/Hepatic   Endo/Other  Morbid obesity  Renal/GU      Musculoskeletal   Abdominal   Peds  Hematology   Anesthesia Other Findings       Reproductive/Obstetrics (+) Pregnancy                             Anesthesia Physical Anesthesia Plan  ASA: III  Anesthesia Plan: Epidural   Post-op Pain Management:    Induction:   Airway Management Planned:   Additional Equipment:   Intra-op Plan:   Post-operative Plan:   Informed Consent: I have reviewed the patients History and Physical, chart, labs and discussed the procedure including the risks, benefits and alternatives for the proposed anesthesia with the patient or authorized representative who has indicated his/her understanding and acceptance.   Dental Advisory Given  Plan Discussed with:   Anesthesia Plan Comments: (Labs checked- platelets confirmed with RN in room. Fetal heart tracing, per RN, reported to be stable enough for sitting procedure. Discussed epidural, and patient consents to the procedure:  included risk of possible headache,backache, failed block, allergic reaction, and nerve injury. This patient was asked if she had any questions or concerns before the procedure started.)        Anesthesia Quick Evaluation  

## 2014-04-01 NOTE — Progress Notes (Signed)
At 0357 pt has gush of clear fluid from perineal area. At 0412, vaginal canal was swabbed for a fern test. After reviewing slide, rupture was indeterminate. At 0435, vaginal canal was swabbed again for a repeat fern test. Rupture was still indeterminate after second swab.

## 2014-04-01 NOTE — Progress Notes (Addendum)
In to assess pt. Pt reports she is exhausted.  Declines headache with visual changes.  Swelling in right hand where IV is located.  C/o vulvar swelling and irritation with foley cathter. EFM reviewed from time of discharge.  Bedside ultrasound in progress.  Good fluid seen, fetal breathing, active fetus and tone.  Technician reports prelim BPP as 8/8. In that pt is 1/Th, recommend foley bulb insertion.  Discussed with CNM. Labs reviewed.  Cr improving 0.96, Magnesium 5.0, LFTS and platelets normal.  Continue close observation.  Addendum: UOP 200/50/100/300

## 2014-04-01 NOTE — Consult Note (Signed)
Neonatology Note:   Attendance at Delivery:   I was asked by Dr. Kulwa to attend this NSVD at 34 2/[redacted] weeks GA following induction of labor for severe pre-eclampsia. The mother is a G3P1A1 O pos, GBS neg with known IUGR infant, pre-eclampsia, and non-reassuring antenatal testing. She got 2 doses of Betamethasone on 2/24-25 and was on Magnesium sulfate. ROM 12 hours prior to delivery, fluid clear. Infant vigorous with good spontaneous cry and tone. Needed only bulb suctioning. Pulse oximeter placed and O2 saturations were within expected parameters in room air throughout. Ap 8/9. Lungs clear to ausc in DR. She was seen by her parents in the DR, then was transported to NICU for further care with her father in attendance.  Carrie Windom C. [place] Hedman, MD 

## 2014-04-01 NOTE — Progress Notes (Signed)
Carrie Bullock DriversSierra C Bullock MRN: [id number]  Subjective: -Patient resting in bed.  Reports contractions.  States unsure if ice pack to perineal area is helping; "I can't even feel anything down there."  Patient denies HA, SOB, and epigastric pain.  Reports no needs or complaints at current.    Objective: BP 151/95 mmHg  Pulse 67  Temp(Src) 98.1 F (36.7 C) (Oral)  Resp 18  Ht 5\' 6"  (1.676 m)  Wt 243 lb (110.224 kg)  BMI 39.24 kg/m2  SpO2 100%  LMP 08/04/2013 I/O last 3 completed shifts: In: 3015.4 [P.O.:750; I.V.:2265.4] Out: 3692 [Urine:3692] Total I/O In: 204 [I.V.:204] Out: 176 [Urine:176] FHT:120  bpm, Min Var, -Decels, +10 x 10Accels UC:   Q5-606min, palpates mild SVE:  Deferred Membranes: SROM x 6 hrs Pitocin: 878mUn/min  Physical Exam: General:  No distress noted Chest: Lungs CTA, Heart RRR Abdomen: Soft, NT, Appears AGA GU: Perineal and Labial Edema, Ice pack in place Foley Catheter draining amber colored 60mL noted Ext: +3 pitting edema, +2 DTR Bilaterally U/L Skin: Warm Dry  Assessment:  IUP at 34.2 weeks Cat I FT  IOL   PreEclampsia MgSO4 Infusion  Plan: -Await Mg level, will turn off based on results -Urine output adequate -Patient denies needs -Will continue to monitor  -Continue other mgmt as ordered  Carrie Altmann [name],[name], CNM 04/01/2014, 10:51 AM

## 2014-04-01 NOTE — Progress Notes (Signed)
Carrie Bullock 6 Hours Post Partum S/P SVD   Subjective: -Patient denies HA, visual disturbances, SOB, epigastric pain, and numbness/tingling.  Requests sleep aide for tonight.  Reports minimal pain and tolerating regular diet without issues.   Objective:  Filed Vitals:   04/01/14 2001 04/01/14 2010 04/01/14 2101 04/01/14 2201  BP: 151/90 154/95 158/96 169/97  Pulse: 61 61 64 66  Temp: 98.6 F (37 C)     TempSrc: Oral     Resp: 20     Height:      Weight:      SpO2:       Results for orders placed or performed during the hospital encounter of 03/29/14 (from the past 24 hour(s))  CBC     Status: Abnormal   Collection Time: 04/01/14  7:55 AM  Result Value Ref Range   WBC 13.5 (H) 4.0 - 10.5 K/uL   RBC 3.78 (L) 3.87 - 5.11 MIL/uL   Hemoglobin 11.5 (L) 12.0 - 15.0 g/dL   HCT 16.132.5 (L) 09.636.0 - 04.546.0 %   MCV 86.0 78.0 - 100.0 fL   MCH 30.4 26.0 - 34.0 pg   MCHC 35.4 30.0 - 36.0 g/dL   RDW 40.913.7 81.111.5 - 91.415.5 %   Platelets 185 150 - 400 K/uL  Comprehensive metabolic panel     Status: Abnormal   Collection Time: 04/01/14  7:55 AM  Result Value Ref Range   Sodium 134 (L) 135 - 145 mmol/L   Potassium 4.3 3.5 - 5.1 mmol/L   Chloride 110 96 - 112 mmol/L   CO2 20 19 - 32 mmol/L   Glucose, Bld 97 70 - 99 mg/dL   BUN 19 6 - 23 mg/dL   Creatinine, Ser 7.820.89 0.50 - 1.10 mg/dL   Calcium 7.0 (L) 8.4 - 10.5 mg/dL   Total Protein 4.7 (L) 6.0 - 8.3 g/dL   Albumin 1.9 (L) 3.5 - 5.2 g/dL   AST 24 0 - 37 U/L   ALT 20 0 - 35 U/L   Alkaline Phosphatase 76 39 - 117 U/L   Total Bilirubin 0.3 0.3 - 1.2 mg/dL   GFR calc non Af Amer 89 (L) >90 mL/min   GFR calc Af Amer >90 >90 mL/min   Anion gap 4 (L) 5 - 15  Lactate dehydrogenase     Status: None   Collection Time: 04/01/14  7:55 AM  Result Value Ref Range   LDH 193 94 - 250 U/L  Uric acid     Status: Abnormal   Collection Time: 04/01/14  7:55 AM  Result Value Ref Range   Uric Acid, Serum 7.2 (H) 2.4 - 7.0 mg/dL  Magnesium     Status:  Abnormal   Collection Time: 04/01/14  8:01 AM  Result Value Ref Range   Magnesium 4.7 (H) 1.5 - 2.5 mg/dL  CBC     Status: Abnormal   Collection Time: 04/01/14  5:50 PM  Result Value Ref Range   WBC 14.8 (H) 4.0 - 10.5 K/uL   RBC 4.33 3.87 - 5.11 MIL/uL   Hemoglobin 12.9 12.0 - 15.0 g/dL   HCT 95.637.3 21.336.0 - 08.646.0 %   MCV 86.1 78.0 - 100.0 fL   MCH 29.8 26.0 - 34.0 pg   MCHC 34.6 30.0 - 36.0 g/dL   RDW 57.813.6 46.911.5 - 62.915.5 %   Platelets 203 150 - 400 K/uL    Physical Exam: General: No distress noted Chest: Lungs CTA, Heart RRR Abdomen: Soft, NT, BS  x 4 Q GU: Perineal and Labial Edema, Ice pack in place Foley Catheter draining amber colored noted Ext: +3 pitting edema, +2 DTR Bilaterally U/L Skin: Warm Dry MgSO4 Infusion at 2 grams/hr  Assessment: 6 Hours Postpartum S/P SVD MgSO4 Infusion Hemodynamically Stable  Plan: Patient requests sleep aide for tonight-ambien ordered Discussed POC including labs in am and MgSO4 off at 1730 tomorrow Continue present mgmt as ordered  Sabas Sous, CNM 04/01/2014 11:31 PM

## 2014-04-01 NOTE — Progress Notes (Signed)
S/p SVD. Pt denies SOB, blurry vision. Normal speech, tired. Lungs clear. Reflexes 2+ Increase Magnesium back up to 2 grams per hour.  Monitor UOP closely.

## 2014-04-02 LAB — COMPREHENSIVE METABOLIC PANEL
ALK PHOS: 79 U/L (ref 39–117)
ALT: 18 U/L (ref 0–35)
ANION GAP: 3 — AB (ref 5–15)
AST: 19 U/L (ref 0–37)
Albumin: 1.8 g/dL — ABNORMAL LOW (ref 3.5–5.2)
BUN: 21 mg/dL (ref 6–23)
CALCIUM: 6.7 mg/dL — AB (ref 8.4–10.5)
CHLORIDE: 105 mmol/L (ref 96–112)
CO2: 23 mmol/L (ref 19–32)
CREATININE: 0.95 mg/dL (ref 0.50–1.10)
GFR, EST NON AFRICAN AMERICAN: 83 mL/min — AB (ref >90)
Glucose, Bld: 91 mg/dL (ref 70–99)
POTASSIUM: 4.3 mmol/L (ref 3.5–5.1)
SODIUM: 131 mmol/L — AB (ref 135–145)
Total Bilirubin: 0.3 mg/dL (ref 0.3–1.2)
Total Protein: 4.4 g/dL — ABNORMAL LOW (ref 6.0–8.3)

## 2014-04-02 LAB — CBC
HCT: 33.1 % — ABNORMAL LOW (ref 36.0–46.0)
Hemoglobin: 11.6 g/dL — ABNORMAL LOW (ref 12.0–15.0)
MCH: 30.2 pg (ref 26.0–34.0)
MCHC: 35 g/dL (ref 30.0–36.0)
MCV: 86.2 fL (ref 78.0–100.0)
Platelets: 165 10*3/uL (ref 150–400)
RBC: 3.84 MIL/uL — ABNORMAL LOW (ref 3.87–5.11)
RDW: 13.7 % (ref 11.5–15.5)
WBC: 12.7 10*3/uL — AB (ref 4.0–10.5)

## 2014-04-02 LAB — LACTATE DEHYDROGENASE: LDH: 222 U/L (ref 94–250)

## 2014-04-02 LAB — URIC ACID: URIC ACID, SERUM: 7.7 mg/dL — AB (ref 2.4–7.0)

## 2014-04-02 MED ORDER — LACTATED RINGERS IV SOLN
INTRAVENOUS | Status: DC
  Administered 2014-04-02 (×2): via INTRAVENOUS

## 2014-04-02 MED ORDER — PANTOPRAZOLE SODIUM 40 MG PO TBEC
40.0000 mg | DELAYED_RELEASE_TABLET | Freq: Every day | ORAL | Status: DC
Start: 2014-04-02 — End: 2014-04-03
  Administered 2014-04-02: 40 mg via ORAL
  Filled 2014-04-02: qty 1

## 2014-04-02 MED ORDER — SODIUM CHLORIDE 0.9 % IJ SOLN
3.0000 mL | Freq: Two times a day (BID) | INTRAMUSCULAR | Status: DC
  Administered 2014-04-02 – 2014-04-03 (×2): 3 mL via INTRAVENOUS

## 2014-04-02 MED ORDER — NIFEDIPINE ER OSMOTIC RELEASE 30 MG PO TB24
30.0000 mg | ORAL_TABLET | Freq: Every day | ORAL | Status: DC
  Administered 2014-04-02 – 2014-04-03 (×2): 30 mg via ORAL
  Filled 2014-04-02 (×2): qty 1

## 2014-04-02 NOTE — Anesthesia Postprocedure Evaluation (Signed)
Anesthesia Post Note  Patient: Carrie DriversSierra C Kassebaum  Procedure(s) Performed: * No procedures listed *  Anesthesia type: Epidural  Patient location: Mother/Baby  Post pain: Pain level controlled  Post assessment: Post-op Vital signs reviewed  Last Vitals:  Filed Vitals:   04/02/14 0801  BP: 151/101  Pulse: 72  Temp: 36.7 C  Resp: 16    Post vital signs: Reviewed  Level of consciousness:alert  Complications: No apparent anesthesia complications

## 2014-04-02 NOTE — H&P (Signed)
The patient is receiving protonix by the intravenous route.  Based on criteria approved by the Pharmacy and Therapeutics Committee and the Medical Executive Committee, the medication is being converted to the equivalent oral dose form.  These criteria include: -No Active GI bleeding -Able to tolerate diet of full liquids (or better) or tube feeding -Able to tolerate other medications by the oral or enteral route  If you have any questions about this conversion, please contact the Pharmacy Department (ext [phone]).  Thank you.  Natasha BenceCline, Jaceon Heiberger 04/02/2014

## 2014-04-02 NOTE — Progress Notes (Signed)
Pt seen at ~ 11:00 am. Pt sitting upright in chair reporting she had just vomited.  Denies headache, blurry vision earlier, feels sleepy.   Gen:  Facial swelling has decreased significantly, appears alert, nauseous.  UOP 700/225/50  CBC, CMP stable.  Cr. 0.95 from 0.89.  A/P Severe preeclampsia s/p SVD. Magnesium for seizure prophylaxis until ~ 5 pm.  UOP good. Elevated BP.  Labetalol IV for severe range BP.  Procardia XL 30 mg after Magnesium discontinued.

## 2014-04-03 MED ORDER — PRENATAL MULTIVITAMIN CH: 1.0000 | ORAL_TABLET | Freq: Every day | ORAL | Status: DC

## 2014-04-03 MED ORDER — OXYCODONE-ACETAMINOPHEN 5-325 MG PO TABS: 1.0000 | ORAL_TABLET | ORAL | Status: DC | PRN

## 2014-04-03 MED ORDER — NIFEDIPINE ER 30 MG PO TB24: 30.0000 mg | ORAL_TABLET | Freq: Every day | ORAL | Status: DC

## 2014-04-03 MED ORDER — IBUPROFEN 600 MG PO TABS: 600.0000 mg | ORAL_TABLET | Freq: Four times a day (QID) | ORAL | Status: DC

## 2014-04-03 NOTE — Lactation Note (Signed)
This note was copied from the chart of Girl Philbert RiserSierra Blasdell. Lactation Consultation Note  Patient Name: Girl Philbert RiserSierra Boylan Today's Date: 04/03/2014 Reason for consult: Initial assessment   With this mom of a NICU baby, now 6744 hours old, and 34 4/7 weeks CGA. Mom is in antenatal, just off a mag drip. Her initial choice at admission was formula, but since her baby is small and in NICU, she has decided to try pumping. I showed mom how to hand express, and was easily able to express 2 mls of colostrum. I set DEP in premie setting, and advised mom to pump every 2-3 hours, followed with hand expression. NICU booklet and lactation services reviewed with mom. Mom knows to call for questions/concerns.    Maternal Data Formula Feeding for Exclusion: Yes (baby in NICU, mom in antenatal on magnesium drip) Reason for exclusion: Admission to Intensive Care Unit (ICU) post-partum Has patient been taught Hand Expression?: Yes Does the patient have breastfeeding experience prior to this delivery?: No  Feeding    LATCH Score/Interventions                      Lactation Tools Discussed/Used WIC Program: No Pump Review: Setup, frequency, and cleaning;Milk Storage;Other (comment) (hand expression, premie setting) Initiated by:: c Jamayah Myszka rn lc Date initiated:: 04/03/14 (at 44 hours post partum)   Consult Status Consult Status: Follow-up Date: 04/04/14 Follow-up type: In-patient    Alfred LevinsLee, Jenene Kauffmann Anne 04/03/2014, 3:26 PM

## 2014-04-03 NOTE — Discharge Summary (Signed)
Vaginal Delivery Discharge Summary  ALL information will be verified prior to discharge  Carrie Bullock  DOB:    Oct 21, 1990 MRN:    [id number] CSN:    [id number]  Date of admission:                  03/29/14  Date of discharge:                   04/03/14  Procedures this admission: SVD  Date of Delivery: 04/01/14  Newborn Data:  Live born  Information for the patient's newborn:  Itxel, Wickard [[id number]]  female  Live born female  Birth Weight: 3 lb 11.3 oz (1681 g) APGAR: 8, 9  Home with mother. Name:    History of Present Illness: Carrie Bullock is a 24 y.o. female, [phone], who presents at [redacted]w[redacted]d weeks gestation. The patient has been followed at the [facility] and Gynecology division of [facility]. She was admitted onset of labor. Her pregnancy has been complicated by:  Patient Active Problem List   Diagnosis Date Noted  . SVD (spontaneous vaginal delivery) 04/01/2014  . IUGR (intrauterine growth restriction)   . [redacted] weeks gestation of pregnancy   . Severe preeclampsia 03/31/2014  . Preeclampsia, severe 03/31/2014  . Preeclampsia 03/29/2014    Hospital course: The patient was admitted for IOL d/t severe pre eclampsia.   Her labor was complicated by HTN, edema and Mag. She proceeded to have a vaginal delivery of a healthy infant. Her delivery was not complicated. Her postpartum course was not complicated. She was discharged to home on postpartum day 2 doing well.  Feeding: breast  Contraception: unsure   Discharge hemoglobin: HEMOGLOBIN  Date Value Ref Range Status  04/02/2014 11.6* 12.0 - 15.0 g/dL Final   HCT  Date Value Ref Range Status  04/02/2014 33.1* 36.0 - 46.0 % Final    PreNatal Labs  Antibody: NEG (02/24 1830) Rubella:   immune RPR: Non Reactive (02/25 0835)  HBsAg: Negative (08/28 0000)  HIV: Non-reactive (02/24 0000)  GBS: Negative (02/24 0000)  Discharge Physical Exam:  General: alert and  cooperative Lochia: appropriate Uterine Fundus: firm Incision: healing well DVT Evaluation: No evidence of DVT seen on physical exam.  Intrapartum Procedures: spontaneous vaginal delivery Postpartum Procedures: none Complications-Operative and Postpartum: none  Discharge Diagnoses: Term Pregnancy-delivered,    Activity:           pelvic rest Diet:                routine Medications: Percocet Condition:      stable     Postpartum Teaching: Nutrition, exercise, return to work or school, family visits, sexual activity, home rest, vaginal bleeding, pelvic rest, family planning, s/s of PPD, breast care peri-care and incision care   Discharge to: home, smart start nurse to visit on Wednesday for BP check  Follow-up Information    Follow up with [facility]. Schedule an appointment as soon as possible for a visit in 6 weeks.   Specialty:  Obstetrics and Gynecology   Why:  Postpartum check up   Contact information:   [address] [place] [address] [phone]       Tajah Noguchi, CNM, [name] 04/03/2014. 2:00 PM   Postpartum Care After Vaginal Delivery  After you deliver your newborn (postpartum period), the usual stay in the hospital is 24 72 hours. If there were problems with your  labor or delivery, or if you have other medical problems, you might be in the hospital longer.  While you are in the hospital, you will receive help and instructions on how to care for yourself and your newborn during the postpartum period.  While you are in the hospital:  Be sure to tell your nurses if you have pain or discomfort, as well as where you feel the pain and what makes the pain worse.  If you had an incision made near your vagina (episiotomy) or if you had some tearing during delivery, the nurses may put ice packs on your episiotomy or tear. The ice packs may help to reduce the pain and swelling.  If you are breastfeeding,  you may feel uncomfortable contractions of your uterus for a couple of weeks. This is normal. The contractions help your uterus get back to normal size.  It is normal to have some bleeding after delivery.  For the first 1 3 days after delivery, the flow is red and the amount may be similar to a period.  It is common for the flow to start and stop.  In the first few days, you may pass some small clots. Let your nurses know if you begin to pass large clots or your flow increases.  Do not  flush blood clots down the toilet before having the nurse look at them.  During the next 3 10 days after delivery, your flow should become more watery and pink or brown-tinged in color.  Ten to fourteen days after delivery, your flow should be a small amount of yellowish-white discharge.  The amount of your flow will decrease over the first few weeks after delivery. Your flow may stop in 6 8 weeks. Most women have had their flow stop by 12 weeks after delivery.  You should change your sanitary pads frequently.  Wash your hands thoroughly with soap and water for at least 20 seconds after changing pads, using the toilet, or before holding or feeding your newborn.  You should feel like you need to empty your bladder within the first 6 8 hours after delivery.  In case you become weak, lightheaded, or faint, call your nurse before you get out of bed for the first time and before you take a shower for the first time.  Within the first few days after delivery, your breasts may begin to feel tender and full. This is called engorgement. Breast tenderness usually goes away within 48 72 hours after engorgement occurs. You may also notice milk leaking from your breasts. If you are not breastfeeding, do not stimulate your breasts. Breast stimulation can make your breasts produce more milk.  Spending as much time as possible with your newborn is very important. During this time, you and your newborn can feel close and  get to know each other. Having your newborn stay in your room (rooming in) will help to strengthen the bond with your newborn. It will give you time to get to know your newborn and become comfortable caring for your newborn.  Your hormones change after delivery. Sometimes the hormone changes can temporarily cause you to feel sad or tearful. These feelings should not last more than a few days. If these feelings last longer than that, you should talk to your caregiver.  If desired, talk to your caregiver about methods of family planning or contraception.  Talk to your caregiver about immunizations. Your caregiver may want you to have the following immunizations before leaving the hospital:  Tetanus, diphtheria, and pertussis (Tdap) or tetanus and diphtheria (Td) immunization. It is very important that you and your family (including grandparents) or others caring for your newborn are up-to-date with the Tdap or Td immunizations. The Tdap or Td immunization can help protect your newborn from getting ill.  Rubella immunization.  Varicella (chickenpox) immunization.  Influenza immunization. You should receive this annual immunization if you did not receive the immunization during your pregnancy. Document Released: 11/17/2006 Document Revised: 10/15/2011 Document Reviewed: 09/17/2011 [facility] Patient Information 2014 [place], [place].   Postpartum Depression and Baby Blues  The postpartum period begins right after the birth of a baby. During this time, there is often a great amount of joy and excitement. It is also a time of considerable changes in the life of the parent(s). Regardless of how many times a mother gives birth, each child brings new challenges and dynamics to the family. It is not unusual to have feelings of excitement accompanied by confusing shifts in moods, emotions, and thoughts. All mothers are at risk of developing postpartum depression or the "baby blues." These mood changes can  occur right after giving birth, or they may occur many months after giving birth. The baby blues or postpartum depression can be mild or severe. Additionally, postpartum depression can resolve rather quickly, or it can be a long-term condition. CAUSES Elevated hormones and their rapid decline are thought to be a main cause of postpartum depression and the baby blues. There are a number of hormones that radically change during and after pregnancy. Estrogen and progesterone usually decrease immediately after delivering your baby. The level of thyroid hormone and various cortisol steroids also rapidly drop. Other factors that play a major role in these changes include major life events and genetics.  RISK FACTORS If you have any of the following risks for the baby blues or postpartum depression, know what symptoms to watch out for during the postpartum period. Risk factors that may increase the likelihood of getting the baby blues or postpartum depression include: 1. Havinga personal or family history of depression. 2. Having depression while being pregnant. 3. Having premenstrual or oral contraceptive-associated mood issues. 4. Having exceptional life stress. 5. Having marital conflict. 6. Lacking a social support network. 7. Having a baby with special needs. 8. Having health problems such as diabetes. SYMPTOMS Baby blues symptoms include:  Brief fluctuations in mood, such as going from extreme happiness to sadness.  Decreased concentration.  Difficulty sleeping.  Crying spells, tearfulness.  Irritability.  Anxiety. Postpartum depression symptoms typically begin within the first month after giving birth. These symptoms include:  Difficulty sleeping or excessive sleepiness.  Marked weight loss.  Agitation.  Feelings of worthlessness.  Lack of interest in activity or food. Postpartum psychosis is a very concerning condition and can be dangerous. Fortunately, it is rare. Displaying  any of the following symptoms is cause for immediate medical attention. Postpartum psychosis symptoms include:  Hallucinations and delusions.  Bizarre or disorganized behavior.  Confusion or disorientation. DIAGNOSIS  A diagnosis is made by an evaluation of your symptoms. There are no medical or lab tests that lead to a diagnosis, but there are various questionnaires that a caregiver may use to identify those with the baby blues, postpartum depression, or psychosis. Often times, a screening tool called the [place] Postnatal Depression Scale is used to diagnose depression in the postpartum period.  TREATMENT The baby blues usually goes away on its own in 1 to 2 weeks. Social support is often all  that is needed. You should be encouraged to get adequate sleep and rest. Occasionally, you may be given medicines to help you sleep.  Postpartum depression requires treatment as it can last several months or longer if it is not treated. Treatment may include individual or group therapy, medicine, or both to address any social, physiological, and psychological factors that may play a role in the depression. Regular exercise, a healthy diet, rest, and social support may also be strongly recommended.  Postpartum psychosis is more serious and needs treatment right away. Hospitalization is often needed. HOME CARE INSTRUCTIONS  Get as much rest as you can. Nap when the baby sleeps.  Exercise regularly. Some women find yoga and walking to be beneficial.  Eat a balanced and nourishing diet.  Do little things that you enjoy. Have a cup of tea, take a bubble bath, read your favorite magazine, or listen to your favorite music.  Avoid alcohol.  Ask for help with household chores, cooking, grocery shopping, or running errands as needed. Do not try to do everything.  Talk to people close to you about how you are feeling. Get support from your partner, family members, friends, or other new moms.  Try to stay  positive in how you think. Think about the things you are grateful for.  Do not spend a lot of time alone.  Only take medicine as directed by your caregiver.  Keep all your postpartum appointments.  Let your caregiver know if you have any concerns. SEEK MEDICAL CARE IF: You are having a reaction or problems with your medicine. SEEK IMMEDIATE MEDICAL CARE IF:  You have suicidal feelings.  You feel you may harm the baby or someone else. Document Released: 10/25/2003 Document Revised: 04/14/2011 Document Reviewed: 11/26/2010 [facility] Patient Information 2014 [place], [place].     Breastfeeding Deciding to breastfeed is one of the best choices you can make for you and your baby. A change in hormones during pregnancy causes your breast tissue to grow and increases the number and size of your milk ducts. These hormones also allow proteins, sugars, and fats from your blood supply to make breast milk in your milk-producing glands. Hormones prevent breast milk from being released before your baby is born as well as prompt milk flow after birth. Once breastfeeding has begun, thoughts of your baby, as well as his or her sucking or crying, can stimulate the release of milk from your milk-producing glands.  BENEFITS OF BREASTFEEDING For Your Baby  Your first milk (colostrum) helps your baby's digestive system function better.   There are antibodies in your milk that help your baby fight off infections.   Your baby has a lower incidence of asthma, allergies, and sudden infant death syndrome.   The nutrients in breast milk are better for your baby than infant formulas and are designed uniquely for your baby's needs.   Breast milk improves your baby's brain development.   Your baby is less likely to develop other conditions, such as childhood obesity, asthma, or type 2 diabetes mellitus.  For You   Breastfeeding helps to create a very special bond between you and your baby.    Breastfeeding is convenient. Breast milk is always available at the correct temperature and costs nothing.   Breastfeeding helps to burn calories and helps you lose the weight gained during pregnancy.   Breastfeeding makes your uterus contract to its prepregnancy size faster and slows bleeding (lochia) after you give birth.   Breastfeeding helps to lower your  risk of developing type 2 diabetes mellitus, osteoporosis, and breast or ovarian cancer later in life. SIGNS THAT YOUR BABY IS HUNGRY Early Signs of Hunger  Increased alertness or activity.  Stretching.  Movement of the head from side to side.  Movement of the head and opening of the mouth when the corner of the mouth or cheek is stroked (rooting).  Increased sucking sounds, smacking lips, cooing, sighing, or squeaking.  Hand-to-mouth movements.  Increased sucking of fingers or hands. Late Signs of Hunger  Fussing.  Intermittent crying. Extreme Signs of Hunger Signs of extreme hunger will require calming and consoling before your baby will be able to breastfeed successfully. Do not wait for the following signs of extreme hunger to occur before you initiate breastfeeding:   Restlessness.  A loud, strong cry.   Screaming.   BREASTFEEDING BASICS Breastfeeding Initiation  Find a comfortable place to sit or lie down, with your neck and back well supported.  Place a pillow or rolled up blanket under your baby to bring him or her to the level of your breast (if you are seated). Nursing pillows are specially designed to help support your arms and your baby while you breastfeed.  Make sure that your baby's abdomen is facing your abdomen.   Gently massage your breast. With your fingertips, massage from your chest wall toward your nipple in a circular motion. This encourages milk flow. You may need to continue this action during the feeding if your milk flows slowly.  Support your breast with 4 fingers underneath  and your thumb above your nipple. Make sure your fingers are well away from your nipple and your baby's mouth.   Stroke your baby's lips gently with your finger or nipple.   When your baby's mouth is open wide enough, quickly bring your baby to your breast, placing your entire nipple and as much of the colored area around your nipple (areola) as possible into your baby's mouth.   More areola should be visible above your baby's upper lip than below the lower lip.   Your baby's tongue should be between his or her lower gum and your breast.   Ensure that your baby's mouth is correctly positioned around your nipple (latched). Your baby's lips should create a seal on your breast and be turned out (everted).  It is common for your baby to suck about 2-3 minutes in order to start the flow of breast milk. Latching Teaching your baby how to latch on to your breast properly is very important. An improper latch can cause nipple pain and decreased milk supply for you and poor weight gain in your baby. Also, if your baby is not latched onto your nipple properly, he or she may swallow some air during feeding. This can make your baby fussy. Burping your baby when you switch breasts during the feeding can help to get rid of the air. However, teaching your baby to latch on properly is still the best way to prevent fussiness from swallowing air while breastfeeding. Signs that your baby has successfully latched on to your nipple:    Silent tugging or silent sucking, without causing you pain.   Swallowing heard between every 3-4 sucks.    Muscle movement above and in front of his or her ears while sucking.  Signs that your baby has not successfully latched on to nipple:   Sucking sounds or smacking sounds from your baby while breastfeeding.  Nipple pain. If you think your baby has not  latched on correctly, slip your finger into the corner of your baby's mouth to break the suction and place it between  your baby's gums. Attempt breastfeeding initiation again. Signs of Successful Breastfeeding Signs from your baby:   A gradual decrease in the number of sucks or complete cessation of sucking.   Falling asleep.   Relaxation of his or her body.   Retention of a small amount of milk in his or her mouth.   Letting go of your breast by himself or herself. Signs from you:  Breasts that have increased in firmness, weight, and size 1-3 hours after feeding.   Breasts that are softer immediately after breastfeeding.  Increased milk volume, as well as a change in milk consistency and color by the fifth day of breastfeeding.   Nipples that are not sore, cracked, or bleeding. Signs That Your Pecola Leisure is Getting Enough Milk  Wetting at least 3 diapers in a 24-hour period. The urine should be clear and pale yellow by age 68 days.  At least 3 stools in a 24-hour period by age 68 days. The stool should be soft and yellow.  At least 3 stools in a 24-hour period by age 60 days. The stool should be seedy and yellow.  No loss of weight greater than 10% of birth weight during the first 58 days of age.  Average weight gain of 4-7 ounces (113-198 g) per week after age 33 days.  Consistent daily weight gain by age 68 days, without weight loss after the age of 2 weeks. After a feeding, your baby may spit up a small amount. This is common. BREASTFEEDING FREQUENCY AND DURATION Frequent feeding will help you make more milk and can prevent sore nipples and breast engorgement. Breastfeed when you feel the need to reduce the fullness of your breasts or when your baby shows signs of hunger. This is called "breastfeeding on demand." Avoid introducing a pacifier to your baby while you are working to establish breastfeeding (the first 4-6 weeks after your baby is born). After this time you may choose to use a pacifier. Research has shown that pacifier use during the first year of a baby's life decreases the risk of  sudden infant death syndrome (SIDS). Allow your baby to feed on each breast as long as he or she wants. Breastfeed until your baby is finished feeding. When your baby unlatches or falls asleep while feeding from the first breast, offer the second breast. Because newborns are often sleepy in the first few weeks of life, you may need to awaken your baby to get him or her to feed. Breastfeeding times will vary from baby to baby. However, the following rules can serve as a guide to help you ensure that your baby is properly fed:  Newborns (babies 12 weeks of age or younger) may breastfeed every 1-3 hours.  Newborns should not go longer than 3 hours during the day or 5 hours during the night without breastfeeding.  You should breastfeed your baby a minimum of 8 times in a 24-hour period until you begin to introduce solid foods to your baby at around 33 months of age. BREAST MILK PUMPING Pumping and storing breast milk allows you to ensure that your baby is exclusively fed your breast milk, even at times when you are unable to breastfeed. This is especially important if you are going back to work while you are still breastfeeding or when you are not able to be present during feedings. Your lactation  consultant can give you guidelines on how long it is safe to store breast milk.  A breast pump is a machine that allows you to pump milk from your breast into a sterile bottle. The pumped breast milk can then be stored in a refrigerator or freezer. Some breast pumps are operated by hand, while others use electricity. Ask your lactation consultant which type will work best for you. Breast pumps can be purchased, but some hospitals and breastfeeding support groups lease breast pumps on a monthly basis. A lactation consultant can teach you how to hand express breast milk, if you prefer not to use a pump.  CARING FOR YOUR BREASTS WHILE YOU BREASTFEED Nipples can become dry, cracked, and sore while breastfeeding. The  following recommendations can help keep your breasts moisturized and healthy:  Avoid using soap on your nipples.   Wear a supportive bra. Although not required, special nursing bras and tank tops are designed to allow access to your breasts for breastfeeding without taking off your entire bra or top. Avoid wearing underwire-style bras or extremely tight bras.  Air dry your nipples for 3-18minutes after each feeding.   Use only cotton bra pads to absorb leaked breast milk. Leaking of breast milk between feedings is normal.   Use lanolin on your nipples after breastfeeding. Lanolin helps to maintain your skin's normal moisture barrier. If you use pure lanolin, you do not need to wash it off before feeding your baby again. Pure lanolin is not toxic to your baby. You may also hand express a few drops of breast milk and gently massage that milk into your nipples and allow the milk to air dry. In the first few weeks after giving birth, some women experience extremely full breasts (engorgement). Engorgement can make your breasts feel heavy, warm, and tender to the touch. Engorgement peaks within 3-5 days after you give birth. The following recommendations can help ease engorgement:  Completely empty your breasts while breastfeeding or pumping. You may want to start by applying warm, moist heat (in the shower or with warm water-soaked hand towels) just before feeding or pumping. This increases circulation and helps the milk flow. If your baby does not completely empty your breasts while breastfeeding, pump any extra milk after he or she is finished.  Wear a snug bra (nursing or regular) or tank top for 1-2 days to signal your body to slightly decrease milk production.  Apply ice packs to your breasts, unless this is too uncomfortable for you.  Make sure that your baby is latched on and positioned properly while breastfeeding. If engorgement persists after 48 hours of following these recommendations,  contact your health care provider or a Advertising copywriter. OVERALL HEALTH CARE RECOMMENDATIONS WHILE BREASTFEEDING  Eat healthy foods. Alternate between meals and snacks, eating 3 of each per day. Because what you eat affects your breast milk, some of the foods may make your baby more irritable than usual. Avoid eating these foods if you are sure that they are negatively affecting your baby.  Drink milk, fruit juice, and water to satisfy your thirst (about 10 glasses a day).   Rest often, relax, and continue to take your prenatal vitamins to prevent fatigue, stress, and anemia.  Continue breast self-awareness checks.  Avoid chewing and smoking tobacco.  Avoid alcohol and drug use. Some medicines that may be harmful to your baby can pass through breast milk. It is important to ask your health care provider before taking any medicine, including all over-the-counter  and prescription medicine as well as vitamin and herbal supplements. It is possible to become pregnant while breastfeeding. If birth control is desired, ask your health care provider about options that will be safe for your baby. SEEK MEDICAL CARE IF:   You feel like you want to stop breastfeeding or have become frustrated with breastfeeding.  You have painful breasts or nipples.  Your nipples are cracked or bleeding.  Your breasts are red, tender, or warm.  You have a swollen area on either breast.  You have a fever or chills.  You have nausea or vomiting.  You have drainage other than breast milk from your nipples.  Your breasts do not become full before feedings by the fifth day after you give birth.  You feel sad and depressed.  Your baby is too sleepy to eat well.  Your baby is having trouble sleeping.   Your baby is wetting less than 3 diapers in a 24-hour period.  Your baby has less than 3 stools in a 24-hour period.  Your baby's skin or the white part of his or her eyes becomes yellow.   Your baby  is not gaining weight by 24 days of age. SEEK IMMEDIATE MEDICAL CARE IF:   Your baby is overly tired (lethargic) and does not want to wake up and feed.  Your baby develops an unexplained fever. Document Released: 01/20/2005 Document Revised: 01/25/2013 Document Reviewed: 07/14/2012 [facility] Patient Information 2015 [place], [place]. This information is not intended to replace advice given to you by your health care provider. Make sure you discuss any questions you have with your health care provider.

## 2014-04-06 ENCOUNTER — Ambulatory Visit: Payer: Self-pay

## 2014-04-06 NOTE — Lactation Note (Signed)
This note was copied from the chart of Girl Philbert RiserSierra Shor. Lactation Consultation Note  Patient Name: Girl Philbert RiserSierra Blouch ZOXWR'UToday's Date: 04/06/2014 Reason for consult: Follow-up assessment;NICU baby NICU baby 5 days of life, [id number]. Called to NICU to assist mom to start pumping as mom has decided that she would like to try to pump and give baby whatever she is able to express. Assisted mom to use DEBP in NICU pumping room. Mom able to return-demonstrated hand expression with colostrum visible at both breast. As mom started pumping her milk began to flow, more on left breast. Enc mom to pump every 3 hours for 15 minutes. Enc mom to hand express after pumping and collect EBM. Enc mom to check for knots or hard/tight areas in her breasts while hand expressing and be sure to massage those areas while pumping/hand expressing. Discussed how to use a sports bra for hands-free pumping so she can massage breast while pumping. Mom states that her milk came in with her 26-year-old, but she did not nurse. [facility].   Maternal Data    Feeding    LATCH Score/Interventions                      Lactation Tools Discussed/Used     Consult Status Consult Status: PRN    Geralynn OchsWILLIARD, Jonquil Stubbe 04/06/2014, 5:14 PM

## 2014-05-17 ENCOUNTER — Encounter (HOSPITAL_COMMUNITY): Payer: Self-pay | Admitting: Emergency Medicine

## 2014-05-17 ENCOUNTER — Emergency Department (HOSPITAL_COMMUNITY)
Admission: EM | Admit: 2014-05-17 | Discharge: 2014-05-17 | Disposition: A | Discharge status: Home / Self Care | Payer: Medicaid Other | Attending: Emergency Medicine | Admitting: Emergency Medicine

## 2014-05-17 DIAGNOSIS — Z87891 Personal history of nicotine dependence: Secondary | ICD-10-CM | POA: Insufficient documentation

## 2014-05-17 DIAGNOSIS — Z79899 Other long term (current) drug therapy: Secondary | ICD-10-CM | POA: Insufficient documentation

## 2014-05-17 DIAGNOSIS — M542 Cervicalgia: Secondary | ICD-10-CM | POA: Diagnosis present

## 2014-05-17 DIAGNOSIS — M436 Torticollis: Secondary | ICD-10-CM | POA: Diagnosis not present

## 2014-05-17 MED ORDER — IBUPROFEN 800 MG PO TABS: 800.0000 mg | ORAL_TABLET | Freq: Three times a day (TID) | ORAL | Status: DC

## 2014-05-17 MED ORDER — HYDROCODONE-ACETAMINOPHEN 5-325 MG PO TABS
1.0000 | ORAL_TABLET | Freq: Once | ORAL | Status: AC
  Administered 2014-05-17: 1 via ORAL
  Filled 2014-05-17: qty 1

## 2014-05-17 MED ORDER — METHOCARBAMOL 500 MG PO TABS: 500.0000 mg | ORAL_TABLET | Freq: Two times a day (BID) | ORAL | Status: DC

## 2014-05-17 MED ORDER — HYDROCODONE-ACETAMINOPHEN 5-325 MG PO TABS: 1.0000 | ORAL_TABLET | ORAL | Status: DC | PRN

## 2014-05-17 MED ORDER — HYDROCODONE-ACETAMINOPHEN 5-325 MG PO TABS
1.0000 | ORAL_TABLET | ORAL | Status: DC | PRN
Start: 2014-05-17 — End: 2021-04-25

## 2014-05-17 NOTE — Discharge Instructions (Signed)
Heat Therapy °Heat therapy can help ease sore, stiff, injured, and tight muscles and joints. Heat relaxes your muscles, which may help ease your pain.  °RISKS AND COMPLICATIONS °If you have any of the following conditions, do not use heat therapy unless your health care provider has approved: °· Poor circulation. °· Healing wounds or scarred skin in the area being treated. °· Diabetes, heart disease, or high blood pressure. °· Not being able to feel (numbness) the area being treated. °· Unusual swelling of the area being treated. °· Active infections. °· Blood clots. °· Cancer. °· Inability to communicate pain. This may include young children and people who have problems with their brain function (dementia). °· Pregnancy. °Heat therapy should only be used on old, pre-existing, or long-lasting (chronic) injuries. Do not use heat therapy on new injuries unless directed by your health care provider. °HOW TO USE HEAT THERAPY °There are several different kinds of heat therapy, including: °· Moist heat pack. °· Warm water bath. °· Hot water bottle. °· Electric heating pad. °· Heated gel pack. °· Heated wrap. °· Electric heating pad. °Use the heat therapy method suggested by your health care provider. Follow your health care provider's instructions on when and how to use heat therapy. °GENERAL HEAT THERAPY RECOMMENDATIONS °· Do not sleep while using heat therapy. Only use heat therapy while you are awake. °· Your skin may turn pink while using heat therapy. Do not use heat therapy if your skin turns red. °· Do not use heat therapy if you have new pain. °· High heat or long exposure to heat can cause burns. Be careful when using heat therapy to avoid burning your skin. °· Do not use heat therapy on areas of your skin that are already irritated, such as with a rash or sunburn. °SEEK MEDICAL CARE IF: °· You have blisters, redness, swelling, or numbness. °· You have new pain. °· Your pain is worse. °MAKE SURE  YOU: °· Understand these instructions. °· Will watch your condition. °· Will get help right away if you are not doing well or get worse. °Document Released: 04/14/2011 Document Revised: 06/06/2013 Document Reviewed: 03/15/2013 °ExitCare® Patient Information ©2015 [employer]. This information is not intended to replace advice given to you by your health care provider. Make sure you discuss any questions you have with your health care provider. ° °Torticollis, Acute °You have suddenly (acutely) developed a twisted neck (torticollis). This is usually a self-limited condition. °CAUSES  °Acute torticollis may be caused by malposition, trauma or infection. Most commonly, acute torticollis is caused by sleeping in an awkward position. Torticollis may also be caused by the flexion, extension or twisting of the neck muscles beyond their normal position. Sometimes, the exact cause may not be known. °SYMPTOMS  °Usually, there is pain and limited movement of the neck. Your neck may twist to one side. °DIAGNOSIS  °The diagnosis is often made by physical examination. X-rays, CT scans or MRIs may be done if there is a history of trauma or concern of infection. °TREATMENT  °For a common, stiff neck that develops during sleep, treatment is focused on relaxing the contracted neck muscle. Medications (including shots) may be used to treat the problem. Most cases resolve in several days. Torticollis usually responds to conservative physical therapy. If left untreated, the shortened and spastic neck muscle can cause deformities in the face and neck. Rarely, surgery is required. °HOME CARE INSTRUCTIONS  °· Use over-the-counter and prescription medications as directed by your caregiver. °·   Do stretching exercises and massage the neck as directed by your caregiver. °· Follow up with physical therapy if needed and as directed by your caregiver. °SEEK IMMEDIATE MEDICAL CARE IF:  °· You develop difficulty breathing or noisy breathing  (stridor). °· You drool, develop trouble swallowing or have pain with swallowing. °· You develop numbness or weakness in the hands or feet. °· You have changes in speech or vision. °· You have problems with urination or bowel movements. °· You have difficulty walking. °· You have a fever. °· You have increased pain. °MAKE SURE YOU:  °· Understand these instructions. °· Will watch your condition. °· Will get help right away if you are not doing well or get worse. °Document Released: 01/18/2000 Document Revised: 04/14/2011 Document Reviewed: 02/28/2009 °ExitCare® Patient Information ©2015 [employer]. This information is not intended to replace advice given to you by your health care provider. Make sure you discuss any questions you have with your health care provider. ° °

## 2014-05-17 NOTE — ED Provider Notes (Signed)
CSN: [id number]     Arrival date & time 05/17/14  1154 History   First MD Initiated Contact with Patient 05/17/14 1207     Chief Complaint  Patient presents with  . Neck Pain     (Consider location/radiation/quality/duration/timing/severity/associated sxs/prior Treatment) The history is provided by the patient.    Patient is a 24 y.o. female presenting with complaint of right-sided neck pain x 2 days. She woke up with painful, stiff neck, unable to rotate neck to the right.Tried ibuprofen 800 mg and flexeril that she had left over at home with no improvement. Pain has been persistent and worsening. No fever, numbness/tingling, visual disturbances or recent illnesses.   Past Medical History  Diagnosis Date  . Pregnancy induced hypertension   . Headache    History reviewed. No pertinent past surgical history. Family History  Problem Relation Age of Onset  . Hypertension Mother    History  Substance Use Topics  . Smoking status: Former Smoker -- 0.50 packs/day    Quit date: 03/06/2014  . Smokeless tobacco: Not on file  . Alcohol Use: No     Comment: LAST DRANK- 09-05-2013   OB History    Gravida Para Term Preterm AB TAB SAB Ectopic Multiple Living   3 2  1 1 1    0 1     Review of Systems  Constitutional: Negative.  Negative for fever.  Eyes: Negative for visual disturbance.  Musculoskeletal: Positive for neck pain.  Neurological: Negative for weakness and numbness.      Allergies  Review of patient's allergies indicates no known allergies.  Home Medications   Prior to Admission medications   Medication Sig Start Date End Date Taking? Authorizing Provider  ibuprofen (ADVIL,MOTRIN) 600 MG tablet Take 1 tablet (600 mg total) by mouth every 6 (six) hours. 04/03/14   Venus Standard, CNM  NIFEdipine (PROCARDIA-XL/ADALAT CC) 30 MG 24 hr tablet Take 1 tablet (30 mg total) by mouth daily. 04/03/14   Venus Standard, CNM  oxyCODONE-acetaminophen (PERCOCET/ROXICET) 5-325 MG per  tablet Take 1 tablet by mouth every 4 (four) hours as needed (for pain scale less than 7). 04/03/14   Venus Standard, CNM  Prenatal Vit-Fe Fumarate-FA (PRENATAL MULTIVITAMIN) TABS tablet Take 1 tablet by mouth daily at 12 noon. 04/03/14   Venus Standard, CNM   BP 143/96 mmHg  Pulse 97  Temp(Src) 98.2 F (36.8 C) (Oral)  Resp 18  SpO2 96%  LMP 05/15/2014  Breastfeeding? No Physical Exam  Constitutional: She is oriented to person, place, and time. She appears well-developed and well-nourished.  Neck: Normal range of motion.  Pulmonary/Chest: Effort normal.  Musculoskeletal:  No midline cervical tenderness. There is muscular tenderness and tenseness to right lateral neck c/w muscle spasm. No left sided tenderness. FROM UE's bilaterally, with full grip strength.  Neurological: She is alert and oriented to person, place, and time.  Skin: Skin is warm and dry.    ED Course  Procedures (including critical care time) Labs Review Labs Reviewed - No data to display  Imaging Review No results found.   EKG Interpretation None      MDM   Final diagnoses:  None    1. Torticollis, right  Exam c/w muscular spasm requiring supportive care.     Elpidio AnisShari Halil Rentz, PA-C 05/17/14 1234  Azalia BilisKevin Campos, MD 05/17/14 [phone]

## 2014-05-17 NOTE — ED Notes (Signed)
Pt c/o neck pain, difficulty turning neck, sore throat x 3 days. Pt denies trauma to neck.

## 2015-06-25 ENCOUNTER — Encounter (HOSPITAL_COMMUNITY): Payer: Self-pay | Admitting: Emergency Medicine

## 2015-06-25 ENCOUNTER — Emergency Department (HOSPITAL_COMMUNITY)
Admission: EM | Admit: 2015-06-25 | Discharge: 2015-06-25 | Disposition: A | Discharge status: Home / Self Care | Payer: 59 | Attending: Emergency Medicine | Admitting: Emergency Medicine

## 2015-06-25 DIAGNOSIS — Z87891 Personal history of nicotine dependence: Secondary | ICD-10-CM | POA: Insufficient documentation

## 2015-06-25 DIAGNOSIS — N764 Abscess of vulva: Secondary | ICD-10-CM | POA: Diagnosis not present

## 2015-06-25 DIAGNOSIS — Z79899 Other long term (current) drug therapy: Secondary | ICD-10-CM | POA: Diagnosis not present

## 2015-06-25 DIAGNOSIS — Z791 Long term (current) use of non-steroidal anti-inflammatories (NSAID): Secondary | ICD-10-CM | POA: Insufficient documentation

## 2015-06-25 DIAGNOSIS — L0291 Cutaneous abscess, unspecified: Secondary | ICD-10-CM

## 2015-06-25 MED ORDER — CEPHALEXIN 500 MG PO CAPS: 500.0000 mg | ORAL_CAPSULE | Freq: Two times a day (BID) | ORAL | Status: DC

## 2015-06-25 MED ORDER — NAPROXEN 500 MG PO TABS: 500.0000 mg | ORAL_TABLET | Freq: Two times a day (BID) | ORAL | Status: DC

## 2015-06-25 MED ORDER — HYDROCODONE-ACETAMINOPHEN 5-325 MG PO TABS
1.0000 | ORAL_TABLET | Freq: Once | ORAL | Status: AC
  Administered 2015-06-25: 1 via ORAL
  Filled 2015-06-25: qty 1

## 2015-06-25 NOTE — Discharge Instructions (Signed)
Call and schedule appt with your obgyn or PCP for next 1-2 days. Use hot compresses and warm soaks. Return to ED with new, worsening or concerning symptoms.    Abscess An abscess is an infected area that contains a collection of pus and debris.It can occur in almost any part of the body. An abscess is also known as a furuncle or boil. CAUSES  An abscess occurs when tissue gets infected. This can occur from blockage of oil or sweat glands, infection of hair follicles, or a minor injury to the skin. As the body tries to fight the infection, pus collects in the area and creates pressure under the skin. This pressure causes pain. People with weakened immune systems have difficulty fighting infections and get certain abscesses more often.  SYMPTOMS Usually an abscess develops on the skin and becomes a painful mass that is red, warm, and tender. If the abscess forms under the skin, you may feel a moveable soft area under the skin. Some abscesses break open (rupture) on their own, but most will continue to get worse without care. The infection can spread deeper into the body and eventually into the bloodstream, causing you to feel ill.  DIAGNOSIS  Your caregiver will take your medical history and perform a physical exam. A sample of fluid may also be taken from the abscess to determine what is causing your infection. TREATMENT  Your caregiver may prescribe antibiotic medicines to fight the infection. However, taking antibiotics alone usually does not cure an abscess. Your caregiver may need to make a small cut (incision) in the abscess to drain the pus. In some cases, gauze is packed into the abscess to reduce pain and to continue draining the area. HOME CARE INSTRUCTIONS   Only take over-the-counter or prescription medicines for pain, discomfort, or fever as directed by your caregiver.  If you were prescribed antibiotics, take them as directed. Finish them even if you start to feel better.  If gauze is  used, follow your caregiver's directions for changing the gauze.  To avoid spreading the infection:  Keep your draining abscess covered with a bandage.  Wash your hands well.  Do not share personal care items, towels, or whirlpools with others.  Avoid skin contact with others.  Keep your skin and clothes clean around the abscess.  Keep all follow-up appointments as directed by your caregiver. SEEK MEDICAL CARE IF:   You have increased pain, swelling, redness, fluid drainage, or bleeding.  You have muscle aches, chills, or a general ill feeling.  You have a fever. MAKE SURE YOU:   Understand these instructions.  Will watch your condition.  Will get help right away if you are not doing well or get worse.   This information is not intended to replace advice given to you by your health care provider. Make sure you discuss any questions you have with your health care provider.   Document Released: 10/30/2004 Document Revised: 07/22/2011 Document Reviewed: 04/04/2011 Elsevier Interactive Patient Education [employer]2016 [employer] Inc[employer]

## 2015-06-25 NOTE — ED Provider Notes (Signed)
CSN: [id number]     Arrival date & time 06/25/15  [id number] History   First MD Initiated Contact with Patient 06/25/15 1006     Chief Complaint  Patient presents with  . Abscess   Patient is a 25 y.o. female presenting with abscess.  Abscess Location:  Ano-genital Ano-genital abscess location:  Vulva Size:  2 cm Abscess quality: draining, fluctuance, painful and redness   Abscess quality: no induration and no warmth   Red streaking: no   Duration:  2 days Progression:  Partially resolved Pain details:    Quality:  Throbbing   Progression:  Improving Chronicity:  New Relieved by:  None tried Ineffective treatments:  None tried Associated symptoms: no fever, no nausea and no vomiting   Risk factors: no prior abscess    Ms. Carrie Bullock is a 25 year old female presenting with an abscess. Patient states that the abscess formed approximately 2 days ago. The abscess is located on the right labia. She reports that it has been increasing in size, redness and tenderness over the past 2 days. While patient was in the waiting room, she states that she felt it burst. She reports feeling drainage and moisture at the site of abscess. She examined the area in the bathroom and states that it has reduced in size and is less painful then just prior to arrival. Denies history of abscesses. Denies systemic symptoms including fever, chills, nausea or vomiting. Patient states that all her symptoms have continued to improve while waiting in emergency department. She has no other complaints today.  Past Medical History  Diagnosis Date  . Pregnancy induced hypertension   . Headache    History reviewed. No pertinent past surgical history. Family History  Problem Relation Age of Onset  . Hypertension Mother    Social History  Substance Use Topics  . Smoking status: Former Smoker -- 0.50 packs/day    Quit date: 03/06/2014  . Smokeless tobacco: None  . Alcohol Use: No     Comment: LAST DRANK- 09-05-2013   OB  History    Gravida Para Term Preterm AB TAB SAB Ectopic Multiple Living   3 2  1 1 1    0 1     Review of Systems  Constitutional: Negative for fever.  Gastrointestinal: Negative for nausea and vomiting.  All other systems reviewed and are negative.     Allergies  Review of patient's allergies indicates no known allergies.  Home Medications   Prior to Admission medications   Medication Sig Start Date End Date Taking? Authorizing Provider  cephALEXin (KEFLEX) 500 MG capsule Take 1 capsule (500 mg total) by mouth 2 (two) times daily. 06/25/15   Boni Maclellan, PA-C  cyclobenzaprine (FLEXERIL) 10 MG tablet Take 10 mg by mouth 3 (three) times daily as needed for muscle spasms.    Historical Provider, MD  HYDROcodone-acetaminophen (NORCO/VICODIN) 5-325 MG per tablet Take 1-2 tablets by mouth every 4 (four) hours as needed. 05/17/14   Elpidio AnisShari Upstill, PA-C  ibuprofen (ADVIL,MOTRIN) 800 MG tablet Take 1 tablet (800 mg total) by mouth 3 (three) times daily. 05/17/14   Elpidio AnisShari Upstill, PA-C  methocarbamol (ROBAXIN) 500 MG tablet Take 1 tablet (500 mg total) by mouth 2 (two) times daily. 05/17/14   Elpidio AnisShari Upstill, PA-C  naproxen (NAPROSYN) 500 MG tablet Take 1 tablet (500 mg total) by mouth 2 (two) times daily. 06/25/15   Elnita Surprenant, PA-C  NIFEdipine (PROCARDIA-XL/ADALAT CC) 30 MG 24 hr tablet Take 1 tablet (30 mg total) by mouth  daily. 04/03/14   Venus Standard, CNM  oxyCODONE-acetaminophen (PERCOCET/ROXICET) 5-325 MG per tablet Take 1 tablet by mouth every 4 (four) hours as needed (for pain scale less than 7). 04/03/14   Venus Standard, CNM  Prenatal Vit-Fe Fumarate-FA (PRENATAL MULTIVITAMIN) TABS tablet Take 1 tablet by mouth daily at 12 noon. 04/03/14   Venus Standard, CNM   BP 137/101 mmHg  Pulse 92  Temp(Src) 99.1 F (37.3 C) (Oral)  Resp 20  SpO2 100% Physical Exam  Constitutional: She appears well-developed and well-nourished. No distress.  Nontoxic-appearing  HENT:  Head: Normocephalic and  atraumatic.  Right Ear: External ear normal.  Left Ear: External ear normal.  Eyes: Conjunctivae are normal. Right eye exhibits no discharge. Left eye exhibits no discharge. No scleral icterus.  Neck: Normal range of motion.  Cardiovascular: Normal rate.   Pulmonary/Chest: Effort normal.  Musculoskeletal: Normal range of motion.  Moves all extremities spontaneously  Neurological: She is alert. Coordination normal.  Skin: Skin is warm and dry.  2 cm area of fluctuance noted to right labia. Area is tender to palpation without surrounding induration. No overlying erythema or streaking. Small amount of active drainage of serous fluid. Abscess is contained to the labia and does not extend into the vagina.  Psychiatric: She has a normal mood and affect. Her behavior is normal.  Nursing note and vitals reviewed.   ED Course  Procedures (including critical care time) Labs Review Labs Reviewed - No data to display  Imaging Review No results found. I have personally reviewed and evaluated these images and lab results as part of my medical decision-making.   EKG Interpretation None      MDM   Final diagnoses:  Abscess   25 year old female presenting with abscess to the right labia 2 days. Abscess spontaneously drained while in the waiting room. Patient is afebrile and nontoxic appearing. 2 cm area of fluctuance noted to right labia with small amount of active serous drainage. No induration or overlying erythema. This may represent a Bartholin's cyst or abscess. Discussed options with the patient which includes incision and drainage with Ward catheter placement or continued hot compresses, hot soaks and passive drainage of the abscess. Patient states she would like to avoid incision and drainage at this time and continue to allow abscess to drain on its own. Encouraged patient to schedule follow-up with her OB/GYN in next 1-2 days for wound recheck. Return precautions given in discharge  paperwork and discussed with pt at bedside. Pt stable for discharge     Alveta Heimlich, PA-C 06/25/15 1051  Bethann Berkshire, MD 06/25/15 [phone]

## 2015-06-25 NOTE — ED Notes (Signed)
Declined W/C at D/C and was escorted to lobby by RN. 

## 2015-06-25 NOTE — ED Notes (Signed)
Pt reports a bump to her groin area that is reddened and continues to swell. Pt alert x4.

## 2015-08-27 ENCOUNTER — Other Ambulatory Visit: Payer: Self-pay | Admitting: Family Medicine

## 2015-08-27 DIAGNOSIS — H4710 Unspecified papilledema: Secondary | ICD-10-CM

## 2015-09-05 ENCOUNTER — Ambulatory Visit
Admission: RE | Admit: 2015-09-05 | Discharge: 2015-09-05 | Disposition: A | Discharge status: Home / Self Care | Payer: 59 | Source: Ambulatory Visit | Attending: Family Medicine | Admitting: Family Medicine

## 2015-09-05 DIAGNOSIS — H4710 Unspecified papilledema: Secondary | ICD-10-CM

## 2015-09-05 MED ORDER — GADOBENATE DIMEGLUMINE 529 MG/ML IV SOLN
19.0000 mL | Freq: Once | INTRAVENOUS | Status: AC | PRN
  Administered 2015-09-05: 19 mL via INTRAVENOUS

## 2015-10-25 ENCOUNTER — Other Ambulatory Visit: Payer: Self-pay | Admitting: Specialist

## 2015-10-25 DIAGNOSIS — R51 Headache: Secondary | ICD-10-CM

## 2015-10-25 DIAGNOSIS — G43719 Chronic migraine without aura, intractable, without status migrainosus: Secondary | ICD-10-CM

## 2015-10-25 DIAGNOSIS — M791 Myalgia, unspecified site: Secondary | ICD-10-CM

## 2015-10-25 DIAGNOSIS — G8929 Other chronic pain: Secondary | ICD-10-CM

## 2015-10-25 DIAGNOSIS — R519 Headache, unspecified: Secondary | ICD-10-CM

## 2015-11-23 ENCOUNTER — Other Ambulatory Visit: Payer: Self-pay | Admitting: Specialist

## 2015-11-23 ENCOUNTER — Other Ambulatory Visit (HOSPITAL_COMMUNITY)
Admission: RE | Admit: 2015-11-23 | Discharge: 2015-11-23 | Disposition: A | Discharge status: Home / Self Care | Payer: 59 | Source: Ambulatory Visit | Attending: Radiology | Admitting: Radiology

## 2015-11-23 ENCOUNTER — Ambulatory Visit
Admission: RE | Admit: 2015-11-23 | Discharge: 2015-11-23 | Disposition: A | Discharge status: Home / Self Care | Payer: 59 | Source: Ambulatory Visit | Attending: Specialist | Admitting: Specialist

## 2015-11-23 DIAGNOSIS — G43719 Chronic migraine without aura, intractable, without status migrainosus: Secondary | ICD-10-CM | POA: Insufficient documentation

## 2015-11-23 DIAGNOSIS — M791 Myalgia, unspecified site: Secondary | ICD-10-CM

## 2015-11-23 DIAGNOSIS — R519 Headache, unspecified: Secondary | ICD-10-CM

## 2015-11-23 DIAGNOSIS — R51 Headache: Secondary | ICD-10-CM

## 2015-11-23 DIAGNOSIS — G8929 Other chronic pain: Secondary | ICD-10-CM

## 2015-11-23 LAB — CSF CELL COUNT WITH DIFFERENTIAL
RBC COUNT CSF: 1 {cells}/uL (ref 0–10)
WBC CSF: 2 {cells}/uL (ref 0–5)

## 2015-11-23 LAB — PROTEIN, CSF: TOTAL PROTEIN, CSF: 23 mg/dL (ref 15–45)

## 2015-11-23 LAB — GLUCOSE, CSF: Glucose, CSF: 63 mg/dL (ref 43–76)

## 2015-11-23 NOTE — Progress Notes (Signed)
Two SST tubes of blood drawn from left AC space for LP labs without difficulty; site unremarkable.  jkl

## 2015-11-23 NOTE — Discharge Instructions (Addendum)

## 2015-11-24 LAB — HERPES SIMPLEX VIRUS(HSV) DNA BY PCR
HSV 1 DNA: NOT DETECTED
HSV 2 DNA: NOT DETECTED

## 2015-11-24 LAB — LYME DISEASE ABS IGG, IGM, IFA, CSF
LYME DISEASE AB (IGG), IBL: NOT DETECTED
LYME DISEASE AB (IGM), IBL: NOT DETECTED

## 2015-11-25 LAB — MYCOBACTERIUM TUBERCULOSIS COMPLEX: MTB COMPLEX,PCR,NON-RESP.: NOT DETECTED

## 2015-11-25 LAB — HERPES SIMPLEX VIRUS 1/2 (IGG), CSF
HSV 1 IgG Index:: 0.01
HSV 2 IGG INDEX: 0.08

## 2015-11-26 ENCOUNTER — Telehealth: Payer: Self-pay

## 2015-11-26 LAB — CRYPTOCOCCAL AG, LTX SCR RFLX TITER: Cryptococcal Ag Screen: NOT DETECTED

## 2015-11-26 LAB — CSF CULTURE W GRAM STAIN: Organism ID, Bacteria: NO GROWTH

## 2015-11-26 LAB — VDRL, CSF: VDRL Quant, CSF: NONREACTIVE

## 2015-11-26 LAB — ANGIOTENSIN CONVERTING ENZYME, CSF: ACE, CSF: 7 U/L (ref <=15)

## 2015-11-26 LAB — CSF CULTURE
GRAM STAIN: NONE SEEN
GRAM STAIN: NONE SEEN

## 2015-11-26 NOTE — Telephone Encounter (Signed)
Spoke with patient after her LP here 11/23/15.  She denies any headache and says she's doing well.   jkl

## 2015-11-29 LAB — B. BURGDORFI ANTIBODIES, CSF: LYME AB: NEGATIVE

## 2016-01-11 LAB — FUNGUS CULTURE W SMEAR

## 2016-01-11 LAB — AFB CULTURE WITH SMEAR (NOT AT ARMC)

## 2017-01-24 IMAGING — XA DG FLUORO GUIDE LUMBAR PUNCTURE
1 series · 1 of 1 positions shown · non-contrast
Comparison: none

CLINICAL DATA: Chronic intractable headache.  Myalgia.

[Series 2: ortho standard · 1 of 1 slices shown]
[im 1/1]
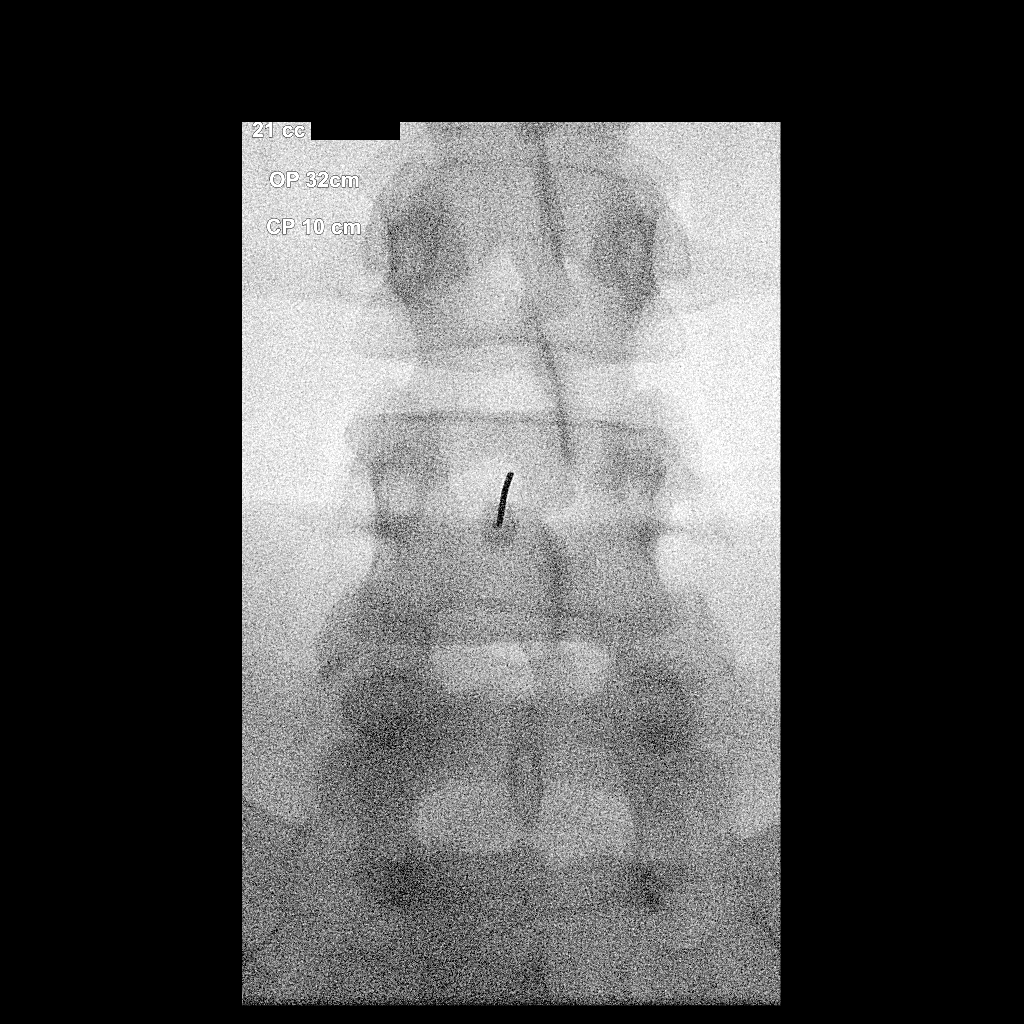

[1 of 1 positions shown; findings below may reference images not displayed]

EXAM:
DIAGNOSTIC LUMBAR PUNCTURE UNDER FLUOROSCOPIC GUIDANCE

FLUOROSCOPY TIME:  Fluoroscopy Time:  3 seconds

Radiation Exposure Index (if provided by the fluoroscopic device):
23.18 microGray*m^2

Number of Acquired Spot Images: 0

PROCEDURE:
Informed consent was obtained from the patient prior to the
procedure, including potential complications of headache, allergy,
and pain. With the patient prone, the lower back was prepped with
Betadine. 1% Lidocaine was used for local anesthesia. Lumbar
puncture was performed at the L3-4 level using a 3.5 inch 20 gauge
needle via a left interlaminar approach with return of clear CSF
with an opening pressure of 32 cm water (measured in the left
lateral decubitus position). 21 ml of CSF were obtained for
laboratory studies. Closing pressure was 10 cm water. The patient
tolerated the procedure well and there were no apparent
complications.
IMPRESSION: Successful fluoroscopic guided lumbar puncture. Opening pressure of
32.

## 2017-04-28 ENCOUNTER — Ambulatory Visit (HOSPITAL_COMMUNITY)
Admission: EM | Admit: 2017-04-28 | Discharge: 2017-04-28 | Disposition: A | Discharge status: Home / Self Care | Payer: 59 | Attending: Family Medicine | Admitting: Family Medicine

## 2017-04-28 ENCOUNTER — Encounter (HOSPITAL_COMMUNITY): Payer: Self-pay | Admitting: Emergency Medicine

## 2017-04-28 DIAGNOSIS — J039 Acute tonsillitis, unspecified: Secondary | ICD-10-CM | POA: Diagnosis not present

## 2017-04-28 DIAGNOSIS — J029 Acute pharyngitis, unspecified: Secondary | ICD-10-CM

## 2017-04-28 LAB — POCT RAPID STREP A: STREPTOCOCCUS, GROUP A SCREEN (DIRECT): NEGATIVE

## 2017-04-28 NOTE — Discharge Instructions (Signed)

## 2017-04-28 NOTE — ED Triage Notes (Signed)
Pt sts sore throat x 2 days  

## 2017-04-28 NOTE — ED Provider Notes (Signed)
[facility]    CSN: [id number] Arrival date & time: 04/28/17  4098     History   Chief Complaint Chief Complaint  Patient presents with  . Sore Throat    HPI Carrie Bullock is a 27 y.o. female presenting today with sore throat and ear pain.  States that Sunday morning she woke up with a soreness to the right side of her throat.  She initially attributed this to drinking the night before.  Symptoms persisted and worsened yesterday.  Symptoms have been going on for 2-3 days overall.  Pain is began radiating up into her right ear and has pain with this.  Denies any other symptoms of congestion, rhinorrhea, cough.  Denies fevers.  Patient has tried Cepacol lozenges as well as Chloraseptic spray.  Neither of provided relief.  Tolerating oral intake, but endorses pain significantly with swallowing.  Pain is 9/10.  HPI  Past Medical History:  Diagnosis Date  . Headache   . Pregnancy induced hypertension     Patient Active Problem List   Diagnosis Date Noted  . SVD (spontaneous vaginal delivery) 04/01/2014  . IUGR (intrauterine growth restriction)   . [redacted] weeks gestation of pregnancy   . Severe preeclampsia 03/31/2014  . Preeclampsia, severe 03/31/2014  . Preeclampsia 03/29/2014    History reviewed. No pertinent surgical history.  OB History    Gravida  3   Para  2   Term      Preterm  1   AB  1   Living  1     SAB      TAB  1   Ectopic      Multiple  0   Live Births  1            Home Medications    Prior to Admission medications   Medication Sig Start Date End Date Taking? Authorizing Provider  cephALEXin (KEFLEX) 500 MG capsule Take 1 capsule (500 mg total) by mouth 2 (two) times daily. 06/25/15   Barrett, Rolm Gala, PA-C  cyclobenzaprine (FLEXERIL) 10 MG tablet Take 10 mg by mouth 3 (three) times daily as needed for muscle spasms.    [provider]  HYDROcodone-acetaminophen (NORCO/VICODIN) 5-325 MG per tablet Take 1-2 tablets by  mouth every 4 (four) hours as needed. 05/17/14   Elpidio Anis, PA-C  ibuprofen (ADVIL,MOTRIN) 800 MG tablet Take 1 tablet (800 mg total) by mouth 3 (three) times daily. 05/17/14   Elpidio Anis, PA-C  methocarbamol (ROBAXIN) 500 MG tablet Take 1 tablet (500 mg total) by mouth 2 (two) times daily. 05/17/14   Elpidio Anis, PA-C  naproxen (NAPROSYN) 500 MG tablet Take 1 tablet (500 mg total) by mouth 2 (two) times daily. 06/25/15   Barrett, Rolm Gala, PA-C  NIFEdipine (PROCARDIA-XL/ADALAT CC) 30 MG 24 hr tablet Take 1 tablet (30 mg total) by mouth daily. 04/03/14   Standard, Venus, CNM  oxyCODONE-acetaminophen (PERCOCET/ROXICET) 5-325 MG per tablet Take 1 tablet by mouth every 4 (four) hours as needed (for pain scale less than 7). 04/03/14   Standard, Venus, CNM  Prenatal Vit-Fe Fumarate-FA (PRENATAL MULTIVITAMIN) TABS tablet Take 1 tablet by mouth daily at 12 noon. 04/03/14   Standard, Venus, CNM    Family History Family History  Problem Relation Age of Onset  . Hypertension Mother     Social History Social History   Tobacco Use  . Smoking status: Former Smoker    Packs/day: 0.50    Last attempt to quit: 03/06/2014  Years since quitting: 3.1  Substance Use Topics  . Alcohol use: No    Comment: LAST DRANK- 09-05-2013  . Drug use: No     Allergies   Patient has no known allergies.   Review of Systems Review of Systems  Constitutional: Negative for chills, fatigue and fever.  HENT: Positive for ear pain, sore throat and trouble swallowing. Negative for congestion, rhinorrhea and sinus pressure.   Respiratory: Negative for cough, chest tightness and shortness of breath.   Cardiovascular: Negative for chest pain.  Gastrointestinal: Negative for abdominal pain, nausea and vomiting.  Musculoskeletal: Negative for myalgias.  Skin: Negative for rash.  Neurological: Negative for dizziness, light-headedness and headaches.     Physical Exam Triage Vital Signs ED Triage Vitals [04/28/17  1943]  Enc Vitals Group     BP 134/90     Pulse Rate 79     Resp 18     Temp 98.8 F (37.1 C)     Temp Source Oral     SpO2 100 %     Weight      Height      Head Circumference      Peak Flow      Pain Score      Pain Loc      Pain Edu?      Excl. in GC?    No data found.  Updated Vital Signs BP 134/90 (BP Location: Left Arm)   Pulse 79   Temp 98.8 F (37.1 C) (Oral)   Resp 18   SpO2 100%   Visual Acuity Right Eye Distance:   Left Eye Distance:   Bilateral Distance:    Right Eye Near:   Left Eye Near:    Bilateral Near:     Physical Exam  Constitutional: She appears well-developed and well-nourished. No distress.  HENT:  Head: Normocephalic and atraumatic.  Bilateral TMs nonerythematous, right auricle nontender to palpation, right tragus nontender to palpation, right mastoid nontender to palpation.  Nasal mucosa minimally erythematous, dried rhinorrhea present.  Posterior oropharynx minimally erythematous, right tonsil slightly larger compared to left.  No uvula swelling or deviation.  Eyes: Conjunctivae are normal.  Neck: Neck supple.  No cervical lymphadenopathy or palpation of any neck masses  Cardiovascular: Normal rate and regular rhythm.  No murmur heard. Pulmonary/Chest: Effort normal and breath sounds normal. No respiratory distress.  Soft breath sounds throughout bilateral lung fields  Abdominal: Soft. There is no tenderness.  Musculoskeletal: She exhibits no edema.  Neurological: She is alert.  Skin: Skin is warm and dry.  Psychiatric: She has a normal mood and affect.  Nursing note and vitals reviewed.    UC Treatments / Results  Labs (all labs ordered are listed, but only abnormal results are displayed) Labs Reviewed  CULTURE, GROUP A STREP [facility]  POCT RAPID STREP A    EKG None Radiology No results found.  Procedures Procedures (including critical care time)  Medications Ordered in UC Medications - No data to  display   Initial Impression / Assessment and Plan / UC Course  I have reviewed the triage vital signs and the nursing notes.  Pertinent labs & imaging results that were available during my care of the patient were reviewed by me and considered in my medical decision making (see chart for details).     Strep test negative.  Likely viral tonsillitis.  Will treat symptomatically.  Discussed further over-the-counter measures she may try. Discussed strict return precautions. Patient verbalized understanding and  is agreeable with plan.   Final Clinical Impressions(s) / UC Diagnoses   Final diagnoses:  Tonsillitis    ED Discharge Orders    None       Controlled Substance Prescriptions [place] Controlled Substance Registry consulted? Not Applicable   Lew Dawes, [place] 04/28/17 2010

## 2017-05-01 LAB — CULTURE, GROUP A STREP (THRC)

## 2018-04-03 ENCOUNTER — Other Ambulatory Visit: Payer: Self-pay

## 2018-04-03 ENCOUNTER — Emergency Department (HOSPITAL_COMMUNITY)
Admission: EM | Admit: 2018-04-03 | Discharge: 2018-04-03 | Disposition: A | Discharge status: Home / Self Care | Payer: No Typology Code available for payment source | Attending: Emergency Medicine | Admitting: Emergency Medicine

## 2018-04-03 ENCOUNTER — Emergency Department (HOSPITAL_COMMUNITY): Payer: No Typology Code available for payment source

## 2018-04-03 ENCOUNTER — Encounter (HOSPITAL_COMMUNITY): Payer: Self-pay | Admitting: *Deleted

## 2018-04-03 DIAGNOSIS — R070 Pain in throat: Secondary | ICD-10-CM | POA: Insufficient documentation

## 2018-04-03 DIAGNOSIS — Z87891 Personal history of nicotine dependence: Secondary | ICD-10-CM | POA: Insufficient documentation

## 2018-04-03 DIAGNOSIS — R072 Precordial pain: Secondary | ICD-10-CM | POA: Diagnosis not present

## 2018-04-03 DIAGNOSIS — R0789 Other chest pain: Secondary | ICD-10-CM | POA: Diagnosis present

## 2018-04-03 DIAGNOSIS — H9201 Otalgia, right ear: Secondary | ICD-10-CM | POA: Diagnosis not present

## 2018-04-03 LAB — BASIC METABOLIC PANEL
Anion gap: 4 — ABNORMAL LOW (ref 5–15)
BUN: 16 mg/dL (ref 6–20)
CALCIUM: 8.7 mg/dL — AB (ref 8.9–10.3)
CO2: 25 mmol/L (ref 22–32)
CREATININE: 0.74 mg/dL (ref 0.44–1.00)
Chloride: 108 mmol/L (ref 98–111)
GFR calc non Af Amer: >60 mL/min (ref >60)
Glucose, Bld: 99 mg/dL (ref 70–99)
Potassium: 4.2 mmol/L (ref 3.5–5.1)
Sodium: 137 mmol/L (ref 135–145)

## 2018-04-03 LAB — I-STAT BETA HCG BLOOD, ED (NOT ORDERABLE): I-stat hCG, quantitative: <5 m[IU]/mL (ref <5)

## 2018-04-03 LAB — CBC
HCT: 36.8 % (ref 36.0–46.0)
Hemoglobin: 11.4 g/dL — ABNORMAL LOW (ref 12.0–15.0)
MCH: 28.9 pg (ref 26.0–34.0)
MCHC: 31 g/dL (ref 30.0–36.0)
MCV: 93.2 fL (ref 80.0–100.0)
NRBC: 0 % (ref 0.0–0.2)
PLATELETS: 230 10*3/uL (ref 150–400)
RBC: 3.95 MIL/uL (ref 3.87–5.11)
RDW: 14 % (ref 11.5–15.5)
WBC: 8 10*3/uL (ref 4.0–10.5)

## 2018-04-03 LAB — POCT I-STAT TROPONIN I: Troponin i, poc: 0 ng/mL (ref 0.00–0.08)

## 2018-04-03 MED ORDER — FAMOTIDINE 20 MG PO TABS: 20.0000 mg | ORAL_TABLET | Freq: Two times a day (BID) | ORAL | 0 refills | Status: DC

## 2018-04-03 MED ORDER — LIDOCAINE VISCOUS HCL 2 % MT SOLN
15.0000 mL | Freq: Once | OROMUCOSAL | Status: AC
  Administered 2018-04-03: 15 mL via ORAL
  Filled 2018-04-03: qty 15

## 2018-04-03 MED ORDER — IBUPROFEN 200 MG PO TABS
600.0000 mg | ORAL_TABLET | Freq: Once | ORAL | Status: AC
  Administered 2018-04-03: 600 mg via ORAL
  Filled 2018-04-03: qty 3

## 2018-04-03 MED ORDER — IBUPROFEN 600 MG PO TABS: 600.0000 mg | ORAL_TABLET | Freq: Four times a day (QID) | ORAL | 0 refills | Status: DC | PRN

## 2018-04-03 MED ORDER — SODIUM CHLORIDE 0.9% FLUSH: 3.0000 mL | Freq: Once | INTRAVENOUS | Status: DC

## 2018-04-03 MED ORDER — PANTOPRAZOLE SODIUM 20 MG PO TBEC: 20.0000 mg | DELAYED_RELEASE_TABLET | Freq: Every day | ORAL | 0 refills | Status: DC

## 2018-04-03 MED ORDER — ALUM & MAG HYDROXIDE-SIMETH 200-200-20 MG/5ML PO SUSP
30.0000 mL | Freq: Once | ORAL | Status: AC
  Administered 2018-04-03: 30 mL via ORAL
  Filled 2018-04-03: qty 30

## 2018-04-03 NOTE — Discharge Instructions (Signed)
Please read and follow all provided instructions.  Your diagnoses today include:  1. Precordial pain     Tests performed today include: An EKG of your heart A chest x-ray Cardiac enzymes - a blood test for heart muscle damage Blood counts and electrolytes Vital signs. See below for your results today.   Medications prescribed:   Take any prescribed medications only as directed.  Follow-up instructions: Please follow-up with your primary care provider as soon as you can for further evaluation of your symptoms.   Return instructions:  SEEK IMMEDIATE MEDICAL ATTENTION IF: You have severe chest pain, especially if the pain is crushing or pressure-like and spreads to the arms, back, neck, or jaw, or if you have sweating, nausea (feeling sick to your stomach), or shortness of breath.  Your chest pain gets worse and does not go away with rest.  You have an attack of chest pain lasting longer than usual, despite rest and treatment with the medications your caregiver has prescribed.  You wake from sleep with chest pain or shortness of breath. You feel dizzy or faint. You have chest pain not typical of your usual pain for which you originally saw your caregiver.  You have any other emergent concerns regarding your health.  Additional Information: Chest pain comes from many different causes. Your caregiver has diagnosed you as having chest pain that is not specific for one problem, but does not require admission.  You are at low risk for an acute heart condition or other serious illness.   Your vital signs today were: BP 122/90    Pulse 84    Temp 97.8 F (36.6 C) (Oral)    Resp 18    Ht 5\' 7"  (1.702 m)    Wt 86.5 kg    LMP 03/29/2018 (Approximate)    SpO2 100%    BMI 29.85 kg/m  If your blood pressure (BP) was elevated above 135/85 this visit, please have this repeated by your doctor within one month. --------------

## 2018-04-03 NOTE — ED Provider Notes (Addendum)
[facility]EMERGENCY [facility]PT Provider Note   CSN: [id number] Arrival date & time: 04/03/18  2449    History   Chief Complaint Chief Complaint  Patient presents with  . Chest Pain    HPI Carrie Bullock is a 28 y.o. female.     HPI  Patient is a 28 year old female with a history of headaches and pregnancy-induced hypertension presenting for central to left-sided chest pain.  Patient reports her pain began 2 days ago.  Patient reports that it initially began as a sharp pain that was reproducible on palpation of her chest wall.  She reports it is more uncomfortable when she is laying flat, and she prefers to sit with head of the bed of 30 degrees to upright.  She reports that 2 days prior to the onset of her pain she had a mild right-sided sore throat as well as some right-sided ear pain.  No fevers.  Patient denies any cough.  Patient denies any shortness of breath or pleuritic pain.  No hemoptysis.  Patient denies any recent mobilization, hospitalization, hormone use, cancer treatment, history DVT/PE, lower extremity edema or calf tenderness.  No family history of cardiovascular disease.  Patient has no personal history of cardiovascular disease.  Patient reports she has tried Tylenol which did not relieve her symptoms.  Past Medical History:  Diagnosis Date  . Headache   . Pregnancy induced hypertension     Patient Active Problem List   Diagnosis Date Noted  . SVD (spontaneous vaginal delivery) 04/01/2014  . IUGR (intrauterine growth restriction)   . [redacted] weeks gestation of pregnancy   . Severe preeclampsia 03/31/2014  . Preeclampsia, severe 03/31/2014  . Preeclampsia 03/29/2014    History reviewed. No pertinent surgical history.   OB History    Gravida  3   Para  2   Term      Preterm  1   AB  1   Living  1     SAB      TAB  1   Ectopic      Multiple  0   Live Births  1            Home Medications    Prior to Admission  medications   Medication Sig Start Date End Date Taking? Authorizing Provider  cephALEXin (KEFLEX) 500 MG capsule Take 1 capsule (500 mg total) by mouth 2 (two) times daily. 06/25/15   Barrett, Rolm Gala, PA-C  cyclobenzaprine (FLEXERIL) 10 MG tablet Take 10 mg by mouth 3 (three) times daily as needed for muscle spasms.    [provider]  HYDROcodone-acetaminophen (NORCO/VICODIN) 5-325 MG per tablet Take 1-2 tablets by mouth every 4 (four) hours as needed. 05/17/14   Elpidio Anis, PA-C  ibuprofen (ADVIL,MOTRIN) 800 MG tablet Take 1 tablet (800 mg total) by mouth 3 (three) times daily. 05/17/14   Elpidio Anis, PA-C  methocarbamol (ROBAXIN) 500 MG tablet Take 1 tablet (500 mg total) by mouth 2 (two) times daily. 05/17/14   Elpidio Anis, PA-C  naproxen (NAPROSYN) 500 MG tablet Take 1 tablet (500 mg total) by mouth 2 (two) times daily. 06/25/15   Barrett, Rolm Gala, PA-C  NIFEdipine (PROCARDIA-XL/ADALAT CC) 30 MG 24 hr tablet Take 1 tablet (30 mg total) by mouth daily. 04/03/14   Standard, Venus, CNM  oxyCODONE-acetaminophen (PERCOCET/ROXICET) 5-325 MG per tablet Take 1 tablet by mouth every 4 (four) hours as needed (for pain scale less than 7). 04/03/14   Standard, Venus, CNM  Prenatal  Vit-Fe Fumarate-FA (PRENATAL MULTIVITAMIN) TABS tablet Take 1 tablet by mouth daily at 12 noon. 04/03/14   Standard, Venus, CNM    Family History Family History  Problem Relation Age of Onset  . Hypertension Mother     Social History Social History   Tobacco Use  . Smoking status: Former Smoker    Packs/day: 0.50    Last attempt to quit: 03/06/2014    Years since quitting: 4.0  Substance Use Topics  . Alcohol use: No    Comment: LAST DRANK- 09-05-2013  . Drug use: No     Allergies   Patient has no known allergies.   Review of Systems Review of Systems  Constitutional: Negative for chills and fever.  HENT: Negative for congestion and sore throat.   Eyes: Negative for visual disturbance.  Respiratory:  Negative for cough, chest tightness and shortness of breath.   Cardiovascular: Positive for chest pain. Negative for palpitations and leg swelling.  Gastrointestinal: Negative for abdominal pain, diarrhea, nausea and vomiting.  Genitourinary: Negative for dysuria and flank pain.  Musculoskeletal: Negative for back pain and myalgias.  Skin: Negative for rash.  Neurological: Negative for dizziness, syncope, light-headedness and headaches.     Physical Exam Updated Vital Signs BP 122/90   Pulse 84   Temp 97.8 F (36.6 C) (Oral)   Resp 18   Ht 5\' 7"  (1.702 m)   Wt 86.5 kg   LMP 03/29/2018 (Approximate)   SpO2 100%   BMI 29.85 kg/m   Physical Exam Vitals signs and nursing note reviewed.  Constitutional:      General: She is not in acute distress.    Appearance: She is well-developed.  HENT:     Head: Normocephalic and atraumatic.  Eyes:     Conjunctiva/sclera: Conjunctivae normal.     Pupils: Pupils are equal, round, and reactive to light.  Neck:     Musculoskeletal: Normal range of motion and neck supple.  Cardiovascular:     Rate and Rhythm: Normal rate and regular rhythm.     Pulses:          Radial pulses are 2+ on the right side and 2+ on the left side.       Dorsalis pedis pulses are 2+ on the right side and 2+ on the left side.     Heart sounds: Normal heart sounds, S1 normal and S2 normal. No murmur.     Comments: No lower extremity edema.  No calf tenderness. Pulmonary:     Effort: Pulmonary effort is normal.     Breath sounds: Normal breath sounds. No wheezing or rales.  Abdominal:     General: There is no distension.     Palpations: Abdomen is soft.     Tenderness: There is no abdominal tenderness. There is no guarding.  Musculoskeletal: Normal range of motion.        General: No deformity.  Lymphadenopathy:     Cervical: No cervical adenopathy.  Skin:    General: Skin is warm and dry.     Findings: No erythema or rash.  Neurological:     Mental Status:  She is alert.     Comments: Cranial nerves grossly intact. Patient moves extremities symmetrically and with good coordination.  Psychiatric:        Behavior: Behavior normal.        Thought Content: Thought content normal.        Judgment: Judgment normal.      ED Treatments / Results  Labs (all labs ordered are listed, but only abnormal results are displayed) Labs Reviewed  BASIC METABOLIC PANEL - Abnormal; Notable for the following components:      Result Value   Calcium 8.7 (*)    Anion gap 4 (*)    All other components within normal limits  CBC - Abnormal; Notable for the following components:   Hemoglobin 11.4 (*)    All other components within normal limits  I-STAT TROPONIN, ED  I-STAT BETA HCG BLOOD, ED ([facility], [facility], [facility] ONLY)  I-STAT BETA HCG BLOOD, ED (NOT ORDERABLE)  POCT I-STAT TROPONIN I    EKG EKG Interpretation  Date/Time:  Saturday April 03 2018 05:34:42 EST Ventricular Rate:  85 PR Interval:    QRS Duration: 82 QT Interval:  355 QTC Calculation: 423 R Axis:   72 Text Interpretation:  Sinus rhythm No previous ECGs available Confirmed by Alvira Monday ([id number]) on 04/03/2018 7:03:51 AM   Radiology Dg Chest 2 View  Result Date: 04/03/2018 CLINICAL DATA:  28 year old female with chest pain. EXAM: CHEST - 2 VIEW COMPARISON:  Chest radiograph dated 12/20/2010 FINDINGS: The heart size and mediastinal contours are within normal limits. Both lungs are clear. The visualized skeletal structures are unremarkable. IMPRESSION: No active cardiopulmonary disease. Electronically Signed   By: Elgie Collard M.D.   On: 04/03/2018 06:19    Procedures Procedures (including critical care time)  Medications Ordered in ED Medications  sodium chloride flush (NS) 0.9 % injection 3 mL (has no administration in time range)  ibuprofen (ADVIL,MOTRIN) tablet 600 mg (600 mg Oral Given 04/03/18 0629)  alum & mag hydroxide-simeth (MAALOX/MYLANTA) 200-200-20 MG/5ML suspension 30 mL  (30 mLs Oral Given 04/03/18 0630)    And  lidocaine (XYLOCAINE) 2 % viscous mouth solution 15 mL (15 mLs Oral Given 04/03/18 6045)     Initial Impression / Assessment and Plan / ED Course  I have reviewed the triage vital signs and the nursing notes.  Pertinent labs & imaging results that were available during my care of the patient were reviewed by me and considered in my medical decision making (see chart for details).  Clinical Course as of Feb 29 0742  Sat Apr 03, 2018  0706 Stable over time.  Hemoglobin(!): 11.4 [AM]  0741 Pt not tachypneic on my exam. Read from monitor and believe in error.   Resp(!): 24 [AM]    Clinical Course User Index [AM] Elisha Ponder, PA-C       Differential diagnosis includes ACS, PE, thoracic aortic dissection, Boerhaave's syndrome, cardiac tamponade, pneumothorax, incarcerated diaphragmatic hernia, cholecystitis, esophageal spasm, gastroesophageal reflux, herpes zoster of the thorax, pericarditis, pneumonia, chest wall pain, costochondritis.   Doubt ACS, as delta troponin is negative, initial and repeat EKGs show no signs of ischemia, infarction, or arrhythmia, and HEART score 1 (nonspecific EKG changes). Doubt PE as Well's score 0 and PERC negative, and patient not tachycardic. Doubt TAD by hx, CXR showed no widening mediastinum, and pulses equal in all extremities.  Patient has no enlarged cardiac silhouette on chest x-ray suggestive of effusion.Patient remained nontoxic appearing and in no acute distress during emergency department course. Vital signs stable in the emergency department. Therefore, doubt esophageal rupture, cardiac tamponade, or pneumothorax. Pericarditis considered due to preceding infectious symptoms and pain  improved in upright positions, however patient does not meet diagnostic criteria at this time with nonspecific EKG changes that are not diagnostic of pericarditis, and low concern for pericardial effusion on exam and chest x-ray.  Abnormal labs include slight anemia at 11.4, consistent over time.  At this time, consider musculoskeletal chest wall pain versus costochondritis versus early pericarditis, and will treat with NSAIDs and close follow-up with PCP.  Patient also treated with Maalox and viscous lidocaine for possible esophageal symptoms.  Patient and family understand and are in agreement with plan of care.  Final Clinical Impressions(s) / ED Diagnoses   Final diagnoses:  Precordial pain    ED Discharge Orders         Ordered    ibuprofen (ADVIL,MOTRIN) 600 MG tablet  Every 6 hours PRN     04/03/18 0711    pantoprazole (PROTONIX) 20 MG tablet  Daily     04/03/18 0711    famotidine (PEPCID) 20 MG tablet  2 times daily     04/03/18 0711           Elisha Ponder, PA-C 04/03/18 0741    Elisha Ponder, PA-C 04/03/18 1610    Alvira Monday, MD 04/03/18 2113

## 2018-04-03 NOTE — ED Triage Notes (Signed)
Pt c/o chest pain that began yesterday.  Pt stated "I had the sore throat & earache x 2 days ago but that's better.  The chest pain started in the middle of my chest but now it's on the left side.  Hurts more lying down."  Pt denies n/v/diaphoresis.

## 2018-04-03 NOTE — ED Notes (Signed)
Patient transported to X-ray 

## 2019-06-05 IMAGING — CR DG CHEST 2V
2 series · 2 of 2 positions shown · non-contrast
Comparison: Chest radiograph dated 12/20/2010

CLINICAL DATA: 27-year-old female with chest pain.

EXAM:
CHEST - 2 VIEW

[w chest pa]
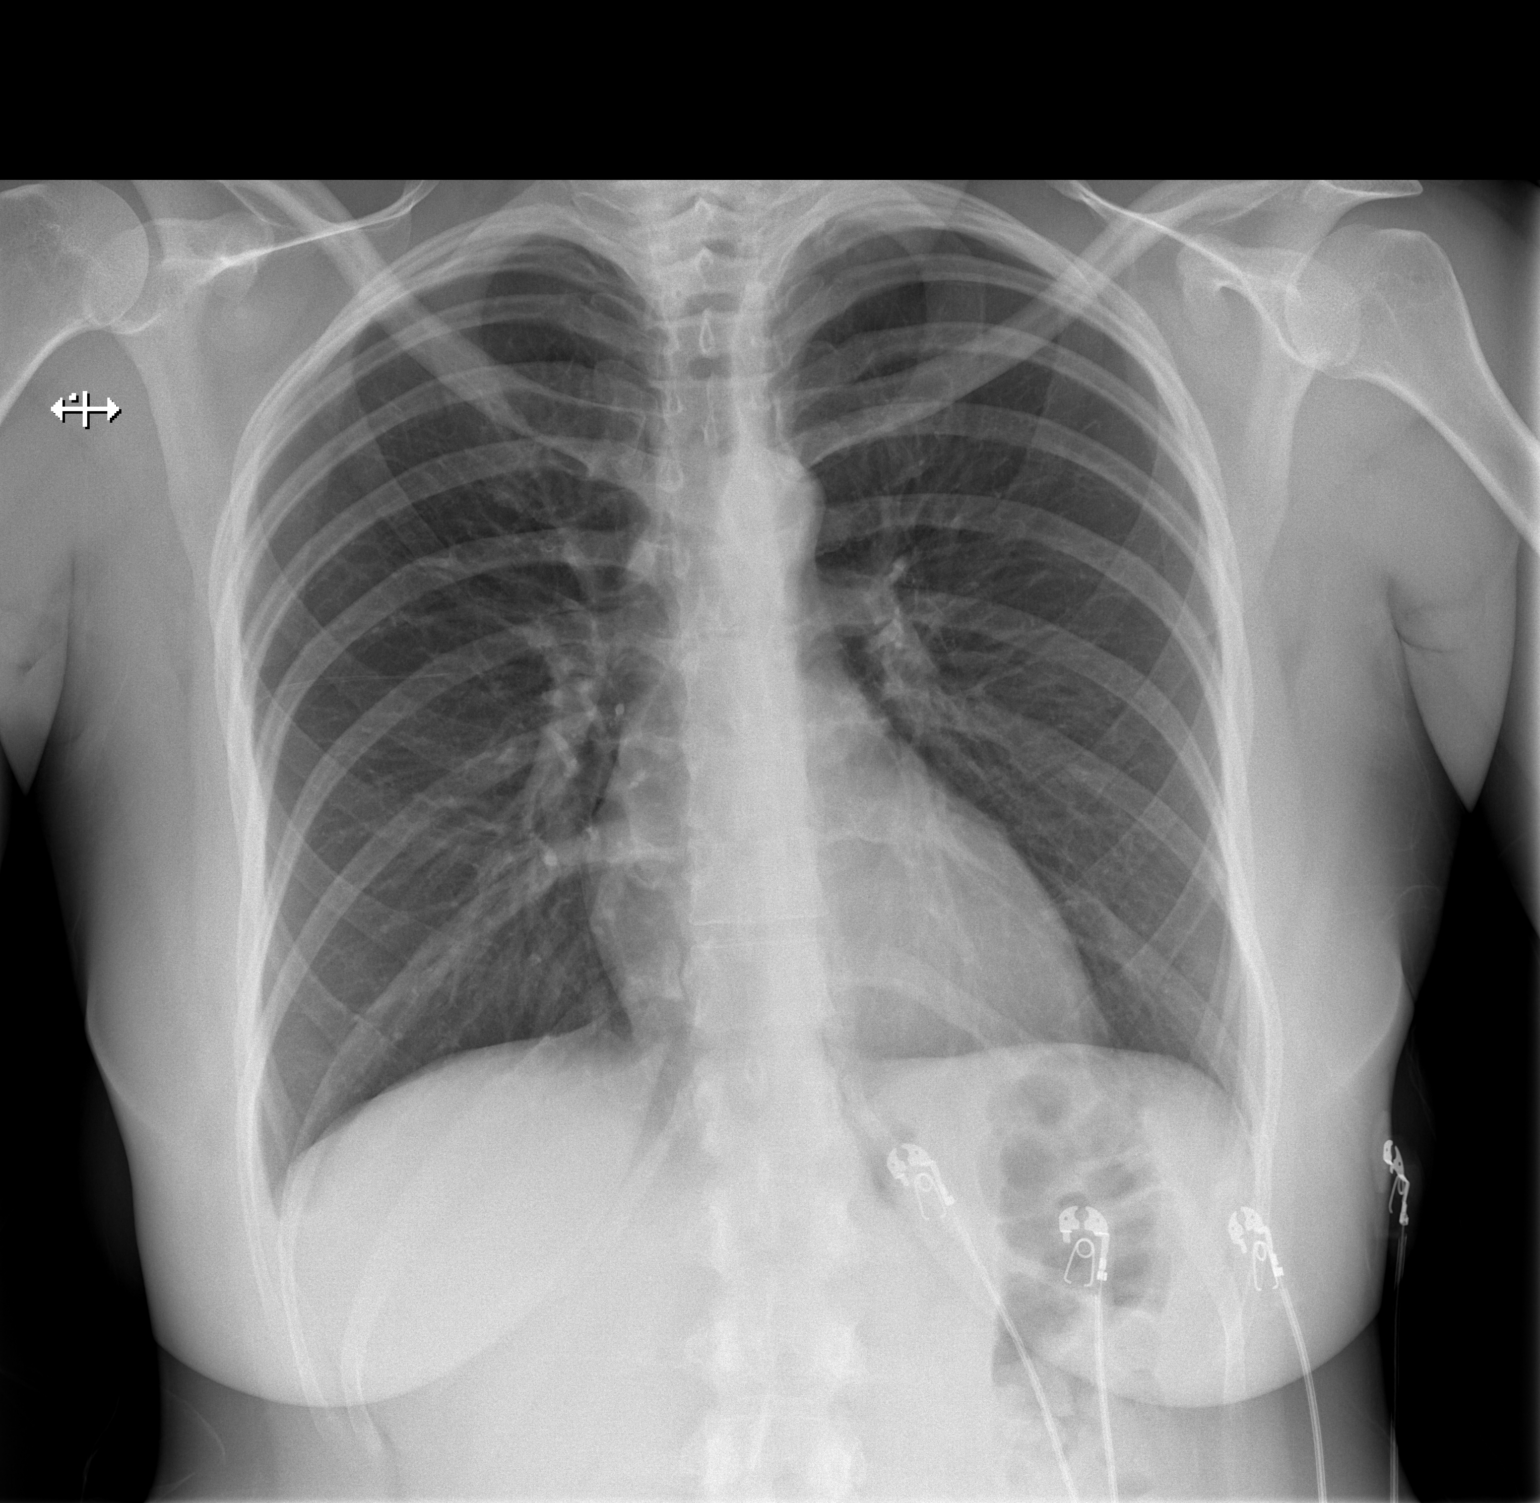

[w chest lat]
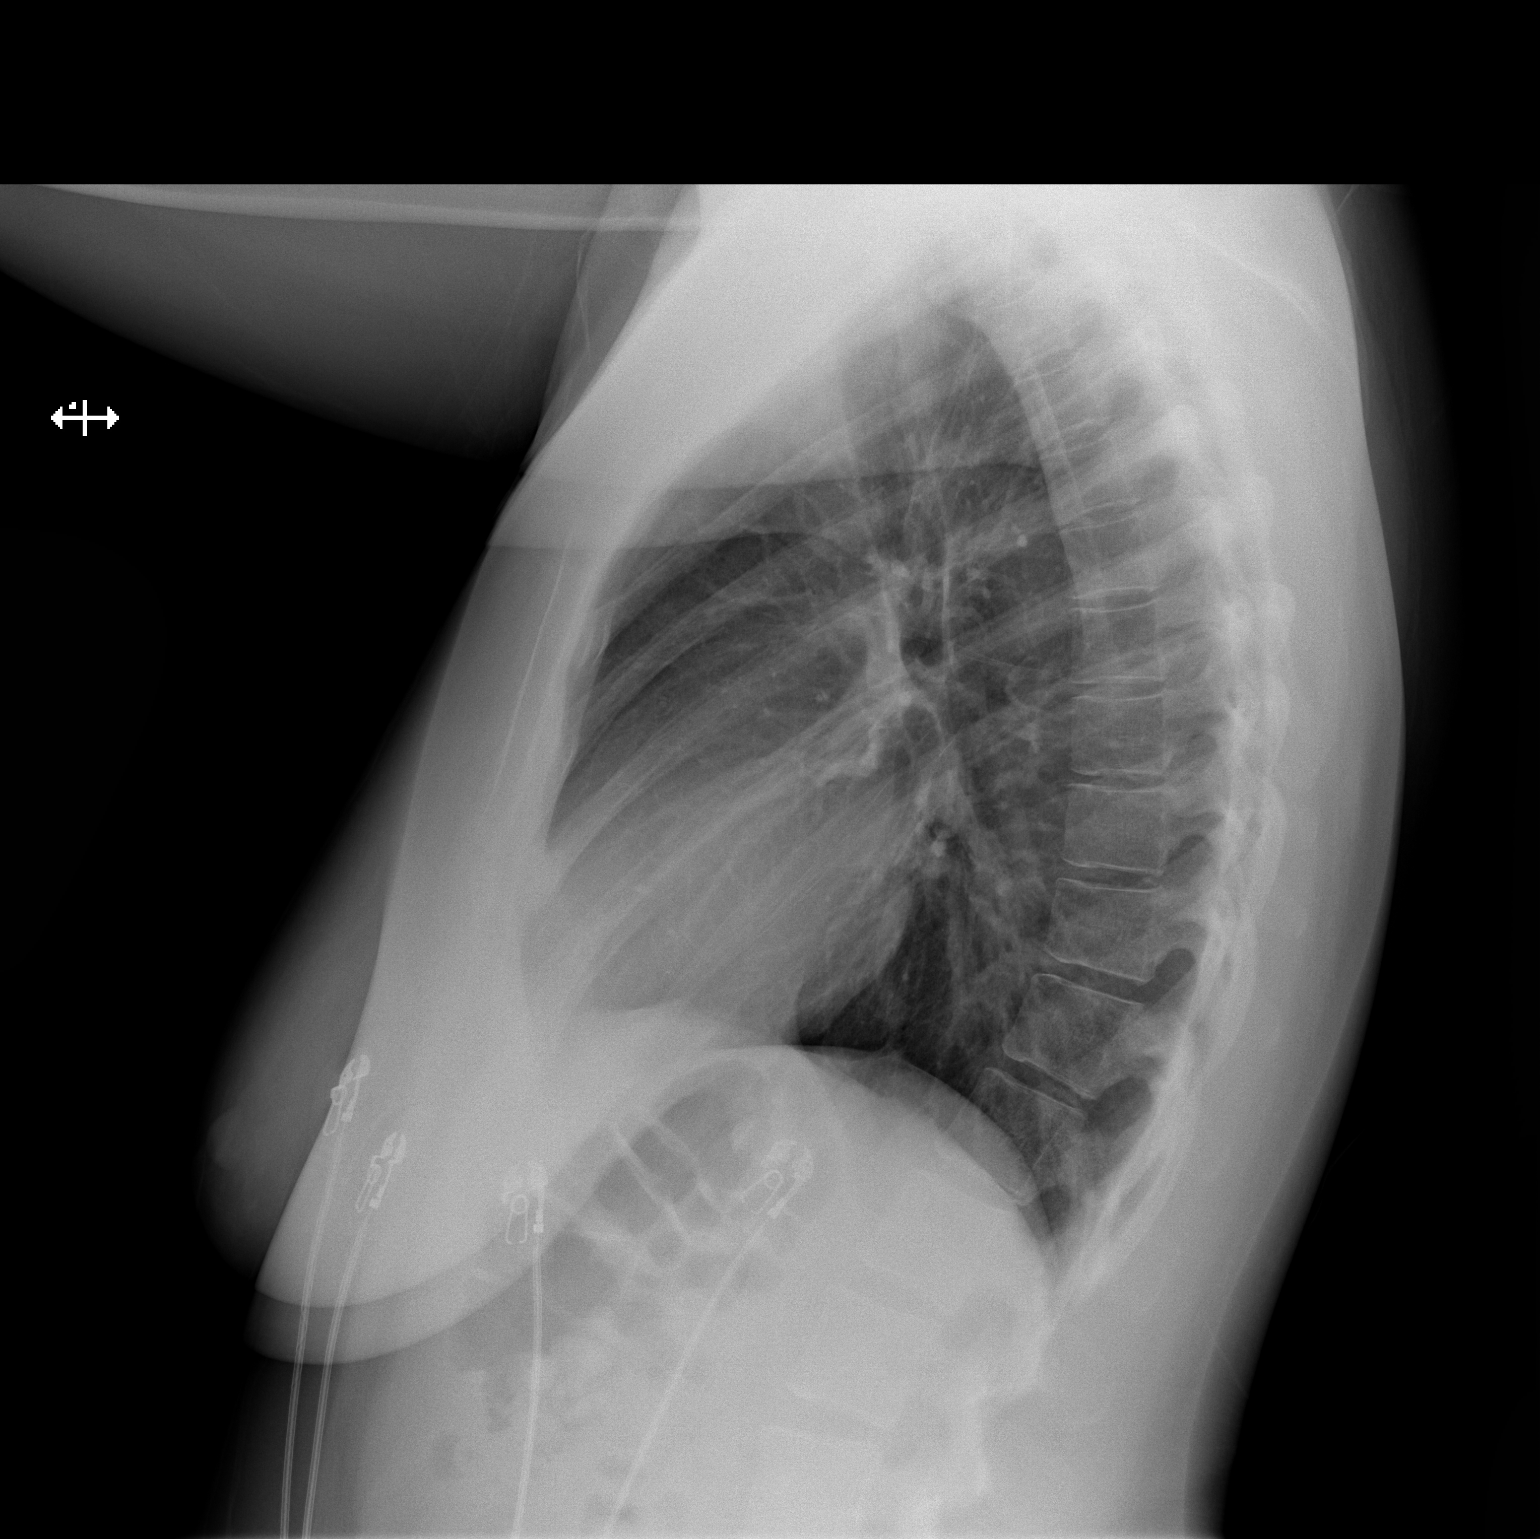

[2 of 2 positions shown; findings below may reference images not displayed]

FINDINGS: The heart size and mediastinal contours are within normal limits.
Both lungs are clear. The visualized skeletal structures are
unremarkable.
IMPRESSION: No active cardiopulmonary disease.

## 2020-05-22 ENCOUNTER — Other Ambulatory Visit: Payer: Self-pay

## 2020-05-22 ENCOUNTER — Ambulatory Visit (HOSPITAL_COMMUNITY)
Admission: EM | Admit: 2020-05-22 | Discharge: 2020-05-22 | Disposition: A | Discharge status: Home / Self Care | Payer: No Typology Code available for payment source | Attending: Physician Assistant | Admitting: Physician Assistant

## 2020-05-22 DIAGNOSIS — Z87891 Personal history of nicotine dependence: Secondary | ICD-10-CM | POA: Insufficient documentation

## 2020-05-22 DIAGNOSIS — Z20822 Contact with and (suspected) exposure to covid-19: Secondary | ICD-10-CM | POA: Diagnosis not present

## 2020-05-22 DIAGNOSIS — J069 Acute upper respiratory infection, unspecified: Secondary | ICD-10-CM | POA: Diagnosis not present

## 2020-05-22 DIAGNOSIS — J111 Influenza due to unidentified influenza virus with other respiratory manifestations: Secondary | ICD-10-CM | POA: Diagnosis not present

## 2020-05-22 DIAGNOSIS — Z28310 Unvaccinated for covid-19: Secondary | ICD-10-CM | POA: Diagnosis not present

## 2020-05-22 DIAGNOSIS — Z3202 Encounter for pregnancy test, result negative: Secondary | ICD-10-CM | POA: Insufficient documentation

## 2020-05-22 DIAGNOSIS — R112 Nausea with vomiting, unspecified: Secondary | ICD-10-CM | POA: Diagnosis present

## 2020-05-22 DIAGNOSIS — Z79899 Other long term (current) drug therapy: Secondary | ICD-10-CM | POA: Diagnosis not present

## 2020-05-22 DIAGNOSIS — Z791 Long term (current) use of non-steroidal anti-inflammatories (NSAID): Secondary | ICD-10-CM | POA: Insufficient documentation

## 2020-05-22 DIAGNOSIS — R051 Acute cough: Secondary | ICD-10-CM | POA: Insufficient documentation

## 2020-05-22 LAB — POC URINE PREG, ED: Preg Test, Ur: NEGATIVE

## 2020-05-22 MED ORDER — BENZONATATE 100 MG PO CAPS: 100.0000 mg | ORAL_CAPSULE | Freq: Three times a day (TID) | ORAL | 0 refills | Status: DC

## 2020-05-22 MED ORDER — OSELTAMIVIR PHOSPHATE 75 MG PO CAPS: 75.0000 mg | ORAL_CAPSULE | Freq: Two times a day (BID) | ORAL | 0 refills | Status: DC

## 2020-05-22 NOTE — Discharge Instructions (Signed)
Take Tamiflu twice daily for 5 days as this will help to treat the flu if that is what you have.  Use Tessalon to help manage your cough.  Recommend Flonase and Mucinex for additional symptom relief.  Make sure to rest and drink plenty of fluid.  If anything worsens please return for reevaluation.  We will be in touch with your COVID-19 results as soon as we have them.

## 2020-05-22 NOTE — ED Triage Notes (Signed)
Pt present fever, coughing and nasal congestion. Symptom started five days ago. Pt states that she took an at home covid test the results was negative.

## 2020-05-22 NOTE — ED Provider Notes (Signed)
[facility]    CSN: [id number] Arrival date & time: 05/22/20  1254      History   Chief Complaint Chief Complaint  Patient presents with  . Cough  . Fever  . Nasal Congestion    HPI Carrie Bullock is a 30 y.o. female.   Patient presents today with a 2-day history of cough.  She reports associated body aches, subjective fever, nausea, 1 episode of vomiting, congestion, sore throat, headache.  She denies any chest pain, dizziness, syncope.  She denies any known sick contacts but was around many people over the weekend.  Reports symptoms began suddenly" hit her like a ton of bricks".  She has tried zinc, elderberry, numerous vitamins but does not use any over-the-counter medications for symptom management.  She has not received influenza or COVID-19 vaccine.  She denies any recent antibiotics.  She does have a history of allergies.  She denies history of asthma or COPD.  She is a current everyday smoker smoking her typical amount.  She has missed work as a result of symptoms.     Past Medical History:  Diagnosis Date  . Headache   . Pregnancy induced hypertension     Patient Active Problem List   Diagnosis Date Noted  . SVD (spontaneous vaginal delivery) 04/01/2014  . IUGR (intrauterine growth restriction)   . [redacted] weeks gestation of pregnancy   . Severe preeclampsia 03/31/2014  . Preeclampsia, severe 03/31/2014  . Preeclampsia 03/29/2014    No past surgical history on file.  OB History    Gravida  3   Para  2   Term      Preterm  1   AB  1   Living  1     SAB      IAB  1   Ectopic      Multiple  0   Live Births  1            Home Medications    Prior to Admission medications   Medication Sig Start Date End Date Taking? Authorizing Provider  benzonatate (TESSALON) 100 MG capsule Take 1 capsule (100 mg total) by mouth every 8 (eight) hours. 05/22/20  Yes Aum Caggiano, Noberto Retort, PA-C  oseltamivir (TAMIFLU) 75 MG capsule Take 1 capsule (75 mg  total) by mouth every 12 (twelve) hours. 05/22/20  Yes Presli Fanguy K, PA-C  cephALEXin (KEFLEX) 500 MG capsule Take 1 capsule (500 mg total) by mouth 2 (two) times daily. 06/25/15   Barrett, Rolm Gala, PA-C  cyclobenzaprine (FLEXERIL) 10 MG tablet Take 10 mg by mouth 3 (three) times daily as needed for muscle spasms.    [provider]  famotidine (PEPCID) 20 MG tablet Take 1 tablet (20 mg total) by mouth 2 (two) times daily for 7 days. 04/03/18 04/10/18  Aviva Kluver B, PA-C  HYDROcodone-acetaminophen (NORCO/VICODIN) 5-325 MG per tablet Take 1-2 tablets by mouth every 4 (four) hours as needed. 05/17/14   Elpidio Anis, PA-C  ibuprofen (ADVIL,MOTRIN) 600 MG tablet Take 1 tablet (600 mg total) by mouth every 6 (six) hours as needed. 04/03/18   Aviva Kluver B, PA-C  methocarbamol (ROBAXIN) 500 MG tablet Take 1 tablet (500 mg total) by mouth 2 (two) times daily. 05/17/14   Elpidio Anis, PA-C  naproxen (NAPROSYN) 500 MG tablet Take 1 tablet (500 mg total) by mouth 2 (two) times daily. 06/25/15   Barrett, Rolm Gala, PA-C  NIFEdipine (PROCARDIA-XL/ADALAT CC) 30 MG 24 hr tablet Take 1 tablet (30  mg total) by mouth daily. 04/03/14   Standard, Venus, CNM  oxyCODONE-acetaminophen (PERCOCET/ROXICET) 5-325 MG per tablet Take 1 tablet by mouth every 4 (four) hours as needed (for pain scale less than 7). 04/03/14   Standard, Venus, CNM  pantoprazole (PROTONIX) 20 MG tablet Take 1 tablet (20 mg total) by mouth daily for 30 days. 04/03/18 05/03/18  Aviva Kluver B, PA-C  Prenatal Vit-Fe Fumarate-FA (PRENATAL MULTIVITAMIN) TABS tablet Take 1 tablet by mouth daily at 12 noon. 04/03/14   Standard, Venus, CNM    Family History Family History  Problem Relation Age of Onset  . Hypertension Mother     Social History Social History   Tobacco Use  . Smoking status: Former Smoker    Packs/day: 0.50    Quit date: 03/06/2014    Years since quitting: 6.2  Substance Use Topics  . Alcohol use: No    Comment: LAST DRANK-  09-05-2013  . Drug use: No     Allergies   Patient has no known allergies.   Review of Systems Review of Systems  Constitutional: Positive for activity change, appetite change, fatigue and fever (subjective).  HENT: Positive for congestion, sinus pressure and sore throat. Negative for sneezing.   Respiratory: Positive for cough and shortness of breath.   Cardiovascular: Negative for chest pain.  Gastrointestinal: Positive for nausea and vomiting. Negative for abdominal pain and diarrhea.  Musculoskeletal: Positive for arthralgias and myalgias.  Neurological: Positive for headaches. Negative for dizziness and light-headedness.     Physical Exam Triage Vital Signs ED Triage Vitals  Enc Vitals Group     BP 05/22/20 1413 (!) 145/100     Pulse Rate 05/22/20 1413 93     Resp 05/22/20 1413 16     Temp 05/22/20 1413 98.6 F (37 C)     Temp Source 05/22/20 1413 Oral     SpO2 05/22/20 1413 98 %     Weight --      Height --      Head Circumference --      Peak Flow --      Pain Score 05/22/20 1414 0     Pain Loc --      Pain Edu? --      Excl. in GC? --    No data found.  Updated Vital Signs BP (!) 145/100 (BP Location: Right Arm)   Pulse 93   Temp 98.6 F (37 C) (Oral)   Resp 16   LMP 05/09/2020   SpO2 98%   Visual Acuity Right Eye Distance:   Left Eye Distance:   Bilateral Distance:    Right Eye Near:   Left Eye Near:    Bilateral Near:     Physical Exam Vitals reviewed.  Constitutional:      General: She is awake. She is not in acute distress.    Appearance: Normal appearance. She is not ill-appearing.     Comments: Very pleasant female appears stated age in no acute distress  HENT:     Head: Normocephalic and atraumatic.     Right Ear: Tympanic membrane, ear canal and external ear normal. Tympanic membrane is not erythematous or bulging.     Left Ear: Tympanic membrane, ear canal and external ear normal. Tympanic membrane is not erythematous or bulging.      Nose:     Right Sinus: No maxillary sinus tenderness or frontal sinus tenderness.     Left Sinus: No maxillary sinus tenderness or frontal sinus tenderness.  Mouth/Throat:     Pharynx: Uvula midline. No oropharyngeal exudate or posterior oropharyngeal erythema.     Comments: Mild drainage posterior oropharynx Cardiovascular:     Rate and Rhythm: Normal rate and regular rhythm.     Heart sounds: No murmur heard.   Pulmonary:     Effort: Pulmonary effort is normal.     Breath sounds: Normal breath sounds. No wheezing, rhonchi or rales.     Comments: Clear to auscultation bilaterally Lymphadenopathy:     Head:     Right side of head: No submental, submandibular or tonsillar adenopathy.     Left side of head: No submental, submandibular or tonsillar adenopathy.     Cervical: No cervical adenopathy.  Psychiatric:        Behavior: Behavior is cooperative.      UC Treatments / Results  Labs (all labs ordered are listed, but only abnormal results are displayed) Labs Reviewed  SARS CORONAVIRUS 2 (TAT 6-24 HRS)  POC URINE PREG, ED    EKG   Radiology No results found.  Procedures Procedures (including critical care time)  Medications Ordered in UC Medications - No data to display  Initial Impression / Assessment and Plan / UC Course  I have reviewed the triage vital signs and the nursing notes.  Pertinent labs & imaging results that were available during my care of the patient were reviewed by me and considered in my medical decision making (see chart for details).     Urine pregnancy was negative in office today.  Unfortunately, we do not have any influenza swabs.  Given clinical presentation will treat empirically for influenza with Tamiflu.  COVID-19 testing was obtained-results pending.  She was prescribed Tessalon for cough symptoms.  Patient was encouraged to use symptomatic treatment for additional symptom relief.  Strict return precautions given to which patient  expressed understanding.  Final Clinical Impressions(s) / UC Diagnoses   Final diagnoses:  Viral URI with cough  Influenza-like illness     Discharge Instructions     Take Tamiflu twice daily for 5 days as this will help to treat the flu if that is what you have.  Use Tessalon to help manage your cough.  Recommend Flonase and Mucinex for additional symptom relief.  Make sure to rest and drink plenty of fluid.  If anything worsens please return for reevaluation.  We will be in touch with your COVID-19 results as soon as we have them.    ED Prescriptions    Medication Sig Dispense Auth. Provider   oseltamivir (TAMIFLU) 75 MG capsule Take 1 capsule (75 mg total) by mouth every 12 (twelve) hours. 10 capsule Marita Burnsed K, PA-C   benzonatate (TESSALON) 100 MG capsule Take 1 capsule (100 mg total) by mouth every 8 (eight) hours. 21 capsule Lynford Espinoza K, PA-C     PDMP not reviewed this encounter.   Jeani Hawking, PA-C 05/22/20 1501

## 2020-05-23 LAB — SARS CORONAVIRUS 2 (TAT 6-24 HRS): SARS Coronavirus 2: NEGATIVE

## 2021-04-11 ENCOUNTER — Encounter: Payer: Self-pay | Admitting: Neurology

## 2021-04-25 ENCOUNTER — Other Ambulatory Visit: Payer: Self-pay

## 2021-04-25 ENCOUNTER — Encounter: Payer: Self-pay | Admitting: Plastic Surgery

## 2021-04-25 ENCOUNTER — Ambulatory Visit (INDEPENDENT_AMBULATORY_CARE_PROVIDER_SITE_OTHER): Payer: No Typology Code available for payment source | Admitting: Plastic Surgery

## 2021-04-25 VITALS — BP 129/89 | HR 85 | Ht 67.0 in | Wt 201.6 lb

## 2021-04-25 DIAGNOSIS — L723 Sebaceous cyst: Secondary | ICD-10-CM

## 2021-04-25 NOTE — Addendum Note (Signed)
Addended by: Verdie Shire on: 04/25/2021 03:18 PM ? ? Modules accepted: Orders ? ?

## 2021-04-25 NOTE — Progress Notes (Signed)
? ?Referring Provider ?Laurann Montana, MD ?[phone] ?[address] ?[place],  [place] [address]  ? ?CC:  ?Chief Complaint  ?Patient presents with  ? Consult  ?   ? ?Carrie Bullock is an 31 y.o. female.  ?HPI: Patient presents to discuss a cyst in the right inferior cheek.  Is been present for over a year.  It is growing in size.  It has drained before but is not currently.  Its intermittently painful.  She like to have it removed. ? ?No Known Allergies ? ?Outpatient Encounter Medications as of 04/25/2021  ?Medication Sig  ? escitalopram (LEXAPRO) 20 MG tablet Take 20 mg by mouth daily.  ? famotidine (PEPCID) 20 MG tablet Take 1 tablet (20 mg total) by mouth 2 (two) times daily for 7 days.  ? HYDROcodone-acetaminophen (NORCO/VICODIN) 5-325 MG per tablet Take 1-2 tablets by mouth every 4 (four) hours as needed.  ? ibuprofen (ADVIL,MOTRIN) 600 MG tablet Take 1 tablet (600 mg total) by mouth every 6 (six) hours as needed.  ? methocarbamol (ROBAXIN) 500 MG tablet Take 1 tablet (500 mg total) by mouth 2 (two) times daily.  ? naproxen (NAPROSYN) 500 MG tablet Take 1 tablet (500 mg total) by mouth 2 (two) times daily.  ? NIFEdipine (PROCARDIA-XL/ADALAT CC) 30 MG 24 hr tablet Take 1 tablet (30 mg total) by mouth daily.  ? oseltamivir (TAMIFLU) 75 MG capsule Take 1 capsule (75 mg total) by mouth every 12 (twelve) hours.  ? oxyCODONE-acetaminophen (PERCOCET/ROXICET) 5-325 MG per tablet Take 1 tablet by mouth every 4 (four) hours as needed (for pain scale less than 7).  ? pantoprazole (PROTONIX) 20 MG tablet Take 1 tablet (20 mg total) by mouth daily for 30 days.  ? Prenatal Vit-Fe Fumarate-FA (PRENATAL MULTIVITAMIN) TABS tablet Take 1 tablet by mouth daily at 12 noon.  ? [DISCONTINUED] benzonatate (TESSALON) 100 MG capsule Take 1 capsule (100 mg total) by mouth every 8 (eight) hours.  ? [DISCONTINUED] cephALEXin (KEFLEX) 500 MG capsule Take 1 capsule (500 mg total) by mouth 2 (two) times daily.  ? [DISCONTINUED]  cyclobenzaprine (FLEXERIL) 10 MG tablet Take 10 mg by mouth 3 (three) times daily as needed for muscle spasms.  ? ?No facility-administered encounter medications on file as of 04/25/2021.  ?  ? ?Past Medical History:  ?Diagnosis Date  ? Headache   ? Pregnancy induced hypertension   ? ? ?No past surgical history on file. ? ?Family History  ?Problem Relation Age of Onset  ? Hypertension Mother   ? ? ?Social History  ? ?Social History Narrative  ? Not on file  ?  ? ?Review of Systems ?General: Denies fevers, chills, weight loss ?CV: Denies chest pain, shortness of breath, palpitations ? ?Physical Exam ? ?  04/25/2021  ? 10:13 AM 05/22/2020  ?  2:13 PM 04/03/2018  ?  8:00 AM  ?Vitals with BMI  ?Height 5\' 7"     ?Weight 201 lbs 10 oz    ?BMI 31.57    ?Systolic 129 145  ?Diastolic 89 100 100  ?Pulse 85 93 69  ?  ?General:  No acute distress,  Alert and oriented, Non-Toxic, Normal speech and affect ?Examination shows a 2.5 cm cystic lesion in the right cheek.  There looks to be a punctum consistent with a cyst.  No other overlying skin changes or surrounding lesions. ? ?Assessment/Plan ?Patient presents with a cystic lesion of the right cheek.  We discussed excision.  We discussed the risks include  bleeding, infection, damage surrounding structures need for additional procedures.  All of her questions were answered she is interested in moving forward. ? ?Allena Napoleon ?04/25/2021, 10:34 AM  ? ? ?  ?

## 2021-05-23 ENCOUNTER — Ambulatory Visit: Payer: No Typology Code available for payment source | Admitting: Plastic Surgery

## 2021-06-06 ENCOUNTER — Ambulatory Visit: Payer: No Typology Code available for payment source | Admitting: Plastic Surgery

## 2021-06-19 ENCOUNTER — Other Ambulatory Visit (HOSPITAL_COMMUNITY)
Admission: RE | Admit: 2021-06-19 | Discharge: 2021-06-19 | Disposition: A | Discharge status: Home / Self Care | Payer: No Typology Code available for payment source | Source: Ambulatory Visit | Attending: Plastic Surgery | Admitting: Plastic Surgery

## 2021-06-19 ENCOUNTER — Ambulatory Visit (INDEPENDENT_AMBULATORY_CARE_PROVIDER_SITE_OTHER): Payer: No Typology Code available for payment source | Admitting: Plastic Surgery

## 2021-06-19 ENCOUNTER — Encounter: Payer: Self-pay | Admitting: Plastic Surgery

## 2021-06-19 VITALS — BP 115/78 | HR 101

## 2021-06-19 DIAGNOSIS — L723 Sebaceous cyst: Secondary | ICD-10-CM | POA: Insufficient documentation

## 2021-06-19 DIAGNOSIS — L720 Epidermal cyst: Secondary | ICD-10-CM

## 2021-06-19 NOTE — Progress Notes (Signed)
Operative Note  ? ?DATE OF OPERATION: 06/19/2021 ? ?LOCATION:   ? ?SURGICAL DEPARTMENT: Plastic Surgery ? ?PREOPERATIVE DIAGNOSES: Right cheek cyst ? ?POSTOPERATIVE DIAGNOSES:  same ? ?PROCEDURE:  ?Excision of right cheek cyst measuring 3 cm ?Complex closure measuring 3 cm ? ?SURGEON: Ancil Linsey, MD ? ?ANESTHESIA:  Local ? ?COMPLICATIONS: None.  ? ?INDICATIONS FOR PROCEDURE:  ?The patient, Carrie Bullock is a 31 y.o. female born on 05-24-1990, is here for treatment of right cheek cyst ?MRN: [id number] ? ?CONSENT:  ?Informed consent was obtained directly from the patient. Risks, benefits and alternatives were fully discussed. Specific risks including but not limited to bleeding, infection, hematoma, seroma, scarring, pain, infection, wound healing problems, and need for further surgery were all discussed. The patient did have an ample opportunity to have questions answered to satisfaction.  ? ?DESCRIPTION OF PROCEDURE:  ?Local anesthesia was administered. The patient's operative site was prepped and draped in a sterile fashion. A time out was performed and all information was confirmed to be correct.  The lesion was excised with a 15 blade.  Hemostasis was obtained.  Circumferential undermining was performed and the skin was advanced and closed in layers with interrupted buried Monocryl sutures and 5-0 fast gut for the skin.  The lesion excised measured 3 cm, and the total length of closure measured 3 cm.   ? ?The patient tolerated the procedure well.  There were no complications. ?  ? ? ? ?  ?

## 2021-06-21 LAB — SURGICAL PATHOLOGY

## 2021-07-03 ENCOUNTER — Encounter: Payer: Self-pay | Admitting: Surgical

## 2021-07-03 ENCOUNTER — Ambulatory Visit (INDEPENDENT_AMBULATORY_CARE_PROVIDER_SITE_OTHER): Payer: No Typology Code available for payment source | Admitting: Student

## 2021-07-03 DIAGNOSIS — L723 Sebaceous cyst: Secondary | ICD-10-CM

## 2021-07-03 DIAGNOSIS — L720 Epidermal cyst: Secondary | ICD-10-CM

## 2021-07-03 NOTE — Progress Notes (Deleted)
Patient is a 31 year old female here for follow-up after excision of sebaceous cyst with Dr. Arita Miss on 06/19/2021.  5-0 fast gut was used for skin closure.  Pathology showed sebaceous cyst.

## 2021-07-03 NOTE — Progress Notes (Signed)
Patient is a 31 year old female who underwent excision of right cheek cyst and closure with Dr. Claudia Desanctis on 06/19/21. Pathology shows benign epidermal inclusion cyst. Patient is here for a follow up visit.   Today patient reports she is doing well. She denies any issues with the excision site. She denies any redness or drainage. She denies any fevers. She denies history of keloids.   Discussed pathology results with patient. Patient acknowledged.    On exam incision is intact and and healing well. There are no wounds noted. There is no erythema or drainage. There are no signs of infection. There is some mild firmness deep to the incision. No signs of keloiding. No sutures are palpated or visualized on exam.   Discussed with patient that she should make sure to use plenty of sunscreen over the area to prevent the scar from darkening. Also discussed with patient that she may use scar creams in 1-2 weeks. Instructed patient to wait another week or so prior to submerging the incision. Discussed that patient may put vaseline over the area for any dryness she may have.   Discussed with patient that if she feels the firmness does not go away in 1-2 months she can call us back to evaluate.   Patient to follow up as needed.   Instructed patient to call us with any questions.

## 2021-07-29 NOTE — Progress Notes (Deleted)
NEUROLOGY CONSULTATION NOTE  Carrie Bullock MRN: [id number] DOB: 13-Sep-1990  Referring provider: Aliene Beams, MD Primary care provider: Aliene Beams, MD  Reason for consult:  pseudotumor cerebri  Assessment/Plan:   ***   Subjective:  Carrie Bullock is a 31 year old female with migraines, anxiety, and OCDwho presents for pseudotumor cerebri.  History supplemented by prior neurology, neuro-ophthalmology and referring provider's notes.  History of migraines since 31 years old.  ***  Also has tension type headaches ***  She reportedly was diagnosed with idiopathic intracranial hypertension in 2017.  Eye exam revealed papilledema.  Lumbar puncture revealed elevated opening pressure.  MRI brain with and without contrast and MRV of head without contrast on 09/05/2015 personally reviewed were normal.  She never pursued further management.  The high-volume LP led to significant improvement in headaches.  However, she never followed up with neurology.    Rescue protocol for migraines:  sumatriptan *** Rescue protocol for tension-type headaches:  baclofen *** Current NSAIDS/analgesics:  *** Current triptans:  sumatriptan 100mg  Current ergotamine:  none Current anti-emetic:  none Current muscle relaxants:  baclofen 10mg  Current Antihypertensive medications:  none Current Antidepressant medications:  escitalopram 10mg  Current Anticonvulsant medications:  none Current anti-CGRP:  none Current Vitamins/Herbal/Supplements:  *** Current Antihistamines/Decongestants:  *** Other therapy:  *** Birth control:  none  Past NSAIDS/analgesics:  Goodys, tramadol Past abortive triptans:  *** Past abortive ergotamine:  *** Past muscle relaxants:  *** Past anti-emetic:  promethazine 25mg  Past antihypertensive medications:  *** Past antidepressant medications:  *** Past anticonvulsant medications:  topiramate Past anti-CGRP:  *** Past vitamins/Herbal/Supplements:  magnesium, B2 Past  antihistamines/decongestants:  *** Other past therapies:  ***  Caffeine:  *** Alcohol:  *** Smoker:  *** Diet:  *** Exercise:  *** Depression:  ***; Anxiety:  *** Other pain:  *** Sleep hygiene:  *** Family history of headache:  ***      PAST MEDICAL HISTORY: Past Medical History:  Diagnosis Date   Headache    Pregnancy induced hypertension     PAST SURGICAL HISTORY: No past surgical history on file.  MEDICATIONS: Current Outpatient Medications on File Prior to Visit  Medication Sig Dispense Refill   escitalopram (LEXAPRO) 20 MG tablet Take 20 mg by mouth daily.     No current facility-administered medications on file prior to visit.    ALLERGIES: No Known Allergies  FAMILY HISTORY: Family History  Problem Relation Age of Onset   Hypertension Mother     Objective:  *** General: No acute distress.  Patient appears well-groomed.   Head:  Normocephalic/atraumatic Eyes:  fundi examined but not visualized Neck: supple, no paraspinal tenderness, full range of motion Back: No paraspinal tenderness Heart: regular rate and rhythm Lungs: Clear to auscultation bilaterally. Vascular: No carotid bruits. Neurological Exam: Mental status: alert and oriented to person, place, and time, recent and remote memory intact, fund of knowledge intact, attention and concentration intact, speech fluent and not dysarthric, language intact. Cranial nerves: CN I: not tested CN II: pupils equal, round and reactive to light, visual fields intact CN III, IV, VI:  full range of motion, no nystagmus, no ptosis CN V: facial sensation intact. CN VII: upper and lower face symmetric CN VIII: hearing intact CN IX, X: gag intact, uvula midline CN XI: sternocleidomastoid and trapezius muscles intact CN XII: tongue midline Bulk & Tone: normal, no fasciculations. Motor:  muscle strength 5/5 throughout Sensation:  Pinprick, temperature and vibratory sensation intact. Deep Tendon Reflexes:  2+  throughout,  toes downgoing.   Finger to nose testing:  Without dysmetria.   Heel to shin:  Without dysmetria.   Gait:  Normal station and stride.  Romberg negative.    Thank you for allowing me to take part in the care of this patient.  Shon Millet, DO  CC:  Aliene Beams, MD

## 2021-07-30 ENCOUNTER — Encounter: Payer: Self-pay | Admitting: Neurology

## 2021-07-30 ENCOUNTER — Ambulatory Visit: Payer: No Typology Code available for payment source | Admitting: Neurology

## 2021-07-30 DIAGNOSIS — Z029 Encounter for administrative examinations, unspecified: Secondary | ICD-10-CM

## 2021-09-24 ENCOUNTER — Other Ambulatory Visit: Payer: Self-pay | Admitting: Obstetrics and Gynecology

## 2021-11-06 NOTE — Progress Notes (Signed)
NEUROLOGY CONSULTATION NOTE  Carrie Bullock MRN: [id number] DOB: 02-20-1990  Referring provider: Laurann Montana, MD Primary care provider: Laurann Montana, MD  Reason for consult:  pseudotumor cerebri, migraine  Assessment/Plan:   Idiopathic intracranial hypertension Migraine without aura, without status migrainosus, not intractable Chronic daily headache - may be rebound headache and/or related to IIH  Refer to ophthalmology to evaluate for papilledema.  If present, start acetazolamide.  If not present, proceed with LP Limit use of pain relievers to no more than 2 days out of week to prevent risk of rebound or medication-overuse headache. Caffeine cessation Recommend not to start the birth control medication as it may contribute to increased intracranial hypertension Follow up after testing.      Subjective:  Carrie Bullock is a 31 year old female with GAD who presents for pseudotumor cerebri and migraines.  History supplemented by prior neurologist's and referring provider's notes.  Onset of headaches:  31 years old, after having her son.   Migraines: Location:  bi-temporal/occipital.  Quality:  cool sensation, throbbing.  Intensity:  moderate-severe.  Aura:  absent.  Prodrome:  absent.  Associated symptoms: photophobia, osmophobia, sees floaters.  She denies nausea, vomiting.  Duration:  30-45 minutes with Goody/BC powder.  Frequency:  Infrequent.  Haven't had one in awhile.  Triggers/aggravating factors:  hunger.  Relieving factors:  eating, rest.  Activity:  aggravates and aggravated by headache.    Other headaches: Location:  bi-frontal  Quality:  pressure.  Intensity:  mild.  Aura:  absent.  Prodrome:  absent.  Associated symptoms: She denies nausea, vomiting, photophobia, phonophobia.  Duration:  30-45 minutes with Goody/BC powder.  Frequency:  Every other day.      In 2017, she had an eye exam that was suspicious for papilledema.  MRI of brain with and without contrast  and MRV of head on 09/05/2015 personally reviewed were normal.  She underwent an LP on 11/23/2015 which revealed an opening pressure of 32 cm water.  Headaches improved following the LP but then returned.  She was never started on acetazolamide.  She worked on weight loss and had some improvement in the headaches.  She was last evaluated by neuro-ophthalmology at [facility] in May 2020, at which that time she had full visual fields and both optic discs revealed mild-moderate (grade 1-2) papilledema.    Notes blurred vision - do not drive at night - last eye appointment in 2020 - no visual obscurations, no pusatile tinnitus    Past NSAIDS/analgesics:  Goody's powder, tramadol Past abortive triptans:  sumatriptan tab Past abortive ergotamine:  none Past muscle relaxants:  none Past anti-emetic:  promethazine Past antihypertensive medications:  propranolol Past antidepressant medications:  escitalopram Past anticonvulsant medications:  topiramate Past anti-CGRP:  none Past vitamins/Herbal/Supplements:   riboflavin 400mg  daily, magnesium 200mg  daily Past antihistamines/decongestants:  none Other past therapies:  none  Current NSAIDS/analgesics: ibuprofen Current triptans:  none Current ergotamine:  none Current anti-emetic:  none Current muscle relaxants:  baclofen 10mg  QHS PRN Current Antihypertensive medications:  none Current Antidepressant medications: none Current Anticonvulsant medications:  none Current anti-CGRP:  none Current Vitamins/Herbal/Supplements: none Current Antihistamines/Decongestants:  none Other therapy:  none Hormone/birth control:  Lo Loestrin Fe (just prescribed - has not started yet)   Caffeine:  2 cups coffee daily Alcohol:  1 shot of tequila every other day Smoker:  1 ppd cigarettes Diet:  Ginger ale.  Drinks a lot of water.  Skips breakfast Exercise:  no Depression:  no; Anxiety:  no Sleep hygiene:  poor.  Trouble falling asleep.  Takes baclofen Family  history of headache:  no      PAST MEDICAL HISTORY: Past Medical History:  Diagnosis Date   Headache    Pregnancy induced hypertension     PAST SURGICAL HISTORY: No past surgical history on file.  MEDICATIONS: Current Outpatient Medications on File Prior to Visit  Medication Sig Dispense Refill   escitalopram (LEXAPRO) 20 MG tablet Take 20 mg by mouth daily.     No current facility-administered medications on file prior to visit.    ALLERGIES: No Known Allergies  FAMILY HISTORY: Family History  Problem Relation Age of Onset   Hypertension Mother     Objective:  Blood pressure 133/88, pulse 95, height 5\' 7"  (1.702 m), weight 210 lb 12.8 oz (95.6 kg), SpO2 99 %. General: No acute distress.  Patient appears well-groomed.   Head:  Normocephalic/atraumatic Eyes:  fundi examined but not visualized Neck: supple, no paraspinal tenderness, full range of motion Back: No paraspinal tenderness Heart: regular rate and rhythm Lungs: Clear to auscultation bilaterally. Vascular: No carotid bruits. Neurological Exam: Mental status: alert and oriented to person, place, and time, speech fluent and not dysarthric, language intact. Cranial nerves: CN I: not tested CN II: pupils equal, round and reactive to light, visual fields intact CN III, IV, VI:  full range of motion, no nystagmus, no ptosis CN V: facial sensation intact. CN VII: upper and lower face symmetric CN VIII: hearing intact CN IX, X: gag intact, uvula midline CN XI: sternocleidomastoid and trapezius muscles intact CN XII: tongue midline Bulk & Tone: normal, no fasciculations. Motor:  muscle strength 5/5 throughout Sensation:  Pinprick, temperature and vibratory sensation intact. Deep Tendon Reflexes:  2+ throughout,  toes downgoing.   Finger to nose testing:  Without dysmetria.   Heel to shin:  Without dysmetria.   Gait:  Normal station and stride.  Romberg negative.    Thank you for allowing me to take part  in the care of this patient.  Metta Clines, DO  CC: Harlan Stains, MD

## 2021-11-08 ENCOUNTER — Ambulatory Visit: Payer: No Typology Code available for payment source | Admitting: Neurology

## 2021-11-08 ENCOUNTER — Encounter: Payer: Self-pay | Admitting: Neurology

## 2021-11-08 VITALS — BP 133/88 | HR 95 | Ht 67.0 in | Wt 210.8 lb

## 2021-11-08 DIAGNOSIS — G932 Benign intracranial hypertension: Secondary | ICD-10-CM | POA: Diagnosis not present

## 2021-11-08 DIAGNOSIS — G43009 Migraine without aura, not intractable, without status migrainosus: Secondary | ICD-10-CM

## 2021-11-08 DIAGNOSIS — R519 Headache, unspecified: Secondary | ICD-10-CM | POA: Diagnosis not present

## 2021-11-08 NOTE — Patient Instructions (Signed)
Refer to ophthalmology for evaluation of increased intracranial pressure.   If exam shows it, we will start acetazolamide.  Otherwise, will proceed with lumbar puncture Must stop caffeine intake Limit use of pain relievers (Goody/BC, ibuprofen, Tylenol, etc)  to no more than 2 days out of week to prevent risk of rebound or medication-overuse headache. Would not start the birth control medication as it may contribute to increased intracranial pressure Follow up

## 2022-04-08 NOTE — Progress Notes (Deleted)
NEUROLOGY FOLLOW UP OFFICE NOTE  Carrie Bullock [id number]  Assessment/Plan:   Idiopathic intracranial hypertension Migraine without aura, without status migrainosus, not intractable Chronic daily headache - may be rebound headache and/or related to [place]   Refer to ophthalmology to evaluate for papilledema.  If present, start acetazolamide.  If not present, proceed with LP Limit use of pain relievers to no more than 2 days out of week to prevent risk of rebound or medication-overuse headache. Caffeine cessation Recommend not to start the birth control medication as it may contribute to increased intracranial hypertension Follow up after testing.         Subjective:  Carrie Bullock is a 32 year old female with GAD who follows up for IIH and migraines.  UPDATE: She was referred to ophthalmology ***  Frequency of abortive medication: *** Current NSAIDS/analgesics: ibuprofen Current triptans:  none Current ergotamine:  none Current anti-emetic:  none Current muscle relaxants:  baclofen '10mg'$  QHS PRN Current Antihypertensive medications:  none Current Antidepressant medications: none Current Anticonvulsant medications:  none Current anti-CGRP:  none Current Vitamins/Herbal/Supplements: none Current Antihistamines/Decongestants:  none Other therapy:  none Hormone/birth control:  Lo Loestrin Fe (just prescribed - has not started yet)     Caffeine:  2 cups coffee daily Alcohol:  1 shot of tequila every other day Smoker:  1 ppd cigarettes Diet:  Ginger ale.  Drinks a lot of water.  Skips breakfast Exercise:  no Depression:  no; Anxiety:  no Sleep hygiene:  poor.  Trouble falling asleep.  Takes baclofen  HISTORY:  Onset of headaches:  32 years old, after having her son.   Migraines: Location:  bi-temporal/occipital.  Quality:  cool sensation, throbbing.  Intensity:  moderate-severe.  Aura:  absent.  Prodrome:  absent.  Associated symptoms: photophobia, osmophobia, sees  floaters.  She denies nausea, vomiting.  Duration:  30-45 minutes with Goody/BC powder.  Frequency:  Infrequent.  Haven't had one in awhile.  Triggers/aggravating factors:  hunger.  Relieving factors:  eating, rest.  Activity:  aggravates and aggravated by headache.     Other headaches: Location:  bi-frontal  Quality:  pressure.  Intensity:  mild.  Aura:  absent.  Prodrome:  absent.  Associated symptoms: She denies nausea, vomiting, photophobia, phonophobia.  Duration:  30-45 minutes with Goody/BC powder.  Frequency:  Every other day.        In 2017, she had an eye exam that was suspicious for papilledema.  MRI of brain with and without contrast and MRV of head on 09/05/2015 personally reviewed were normal.  She underwent an LP on 11/23/2015 which revealed an opening pressure of 32 cm water.  Headaches improved following the LP but then returned.  She was never started on acetazolamide.  She worked on weight loss and had some improvement in the headaches.  She was last evaluated by neuro-ophthalmology at [facility] in May 2020, at which that time she had full visual fields and both optic discs revealed mild-moderate (grade 1-2) papilledema.     Notes blurred vision - do not drive at night - last eye appointment in 2020 - no visual obscurations, no pusatile tinnitus       Past NSAIDS/analgesics:  Goody's powder, tramadol Past abortive triptans:  sumatriptan tab Past abortive ergotamine:  none Past muscle relaxants:  none Past anti-emetic:  promethazine Past antihypertensive medications:  propranolol Past antidepressant medications:  escitalopram Past anticonvulsant medications:  topiramate Past anti-CGRP:  none Past vitamins/Herbal/Supplements:   riboflavin '400mg'$  daily,  magnesium '200mg'$  daily Past antihistamines/decongestants:  none Other past therapies:  none    Family history of headache:  no  PAST MEDICAL HISTORY: Past Medical History:  Diagnosis Date   Headache    Pregnancy induced  hypertension     MEDICATIONS: Current Outpatient Medications on File Prior to Visit  Medication Sig Dispense Refill   baclofen (LIORESAL) 10 MG tablet Take 10 mg by mouth at bedtime as needed.     LO LOESTRIN FE 1 MG-10 MCG / 10 MCG tablet Take 1 tablet by mouth daily.     No current facility-administered medications on file prior to visit.    ALLERGIES: No Known Allergies  FAMILY HISTORY: Family History  Problem Relation Age of Onset   Hypertension Mother       Objective:  *** General: No acute distress.  Patient appears ***-groomed.   Head:  Normocephalic/atraumatic Eyes:  Fundi examined but not visualized Neck: supple, no paraspinal tenderness, full range of motion Heart:  Regular rate and rhythm Lungs:  Clear to auscultation bilaterally Back: No paraspinal tenderness Neurological Exam: alert and oriented to person, place, and time.  Speech fluent and not dysarthric, language intact.  CN II-XII intact. Bulk and tone normal, muscle strength 5/5 throughout.  Sensation to light touch intact.  Deep tendon reflexes 2+ throughout, toes downgoing.  Finger to nose testing intact.  Gait normal, Romberg negative.   Metta Clines, DO  CC: ***

## 2022-04-09 ENCOUNTER — Ambulatory Visit: Payer: No Typology Code available for payment source | Admitting: Neurology

## 2022-04-09 ENCOUNTER — Encounter: Payer: Self-pay | Admitting: Neurology

## 2022-04-09 DIAGNOSIS — Z029 Encounter for administrative examinations, unspecified: Secondary | ICD-10-CM

## 2023-08-04 ENCOUNTER — Ambulatory Visit
Admission: EM | Admit: 2023-08-04 | Discharge: 2023-08-04 | Disposition: A | Discharge status: Home / Self Care | Attending: Family Medicine | Admitting: Family Medicine

## 2023-08-04 ENCOUNTER — Ambulatory Visit (INDEPENDENT_AMBULATORY_CARE_PROVIDER_SITE_OTHER)

## 2023-08-04 DIAGNOSIS — O99891 Other specified diseases and conditions complicating pregnancy: Secondary | ICD-10-CM | POA: Insufficient documentation

## 2023-08-04 DIAGNOSIS — R12 Heartburn: Secondary | ICD-10-CM | POA: Insufficient documentation

## 2023-08-04 DIAGNOSIS — L739 Follicular disorder, unspecified: Secondary | ICD-10-CM | POA: Insufficient documentation

## 2023-08-04 DIAGNOSIS — M545 Low back pain, unspecified: Secondary | ICD-10-CM | POA: Diagnosis not present

## 2023-08-04 DIAGNOSIS — O365990 Maternal care for other known or suspected poor fetal growth, unspecified trimester, not applicable or unspecified: Secondary | ICD-10-CM | POA: Insufficient documentation

## 2023-08-04 DIAGNOSIS — E559 Vitamin D deficiency, unspecified: Secondary | ICD-10-CM | POA: Insufficient documentation

## 2023-08-04 DIAGNOSIS — S3991XA Unspecified injury of abdomen, initial encounter: Secondary | ICD-10-CM | POA: Insufficient documentation

## 2023-08-04 DIAGNOSIS — R109 Unspecified abdominal pain: Secondary | ICD-10-CM | POA: Insufficient documentation

## 2023-08-04 DIAGNOSIS — F17200 Nicotine dependence, unspecified, uncomplicated: Secondary | ICD-10-CM | POA: Insufficient documentation

## 2023-08-04 DIAGNOSIS — E669 Obesity, unspecified: Secondary | ICD-10-CM | POA: Insufficient documentation

## 2023-08-04 DIAGNOSIS — B9689 Other specified bacterial agents as the cause of diseases classified elsewhere: Secondary | ICD-10-CM | POA: Insufficient documentation

## 2023-08-04 DIAGNOSIS — O418X90 Other specified disorders of amniotic fluid and membranes, unspecified trimester, not applicable or unspecified: Secondary | ICD-10-CM | POA: Insufficient documentation

## 2023-08-04 DIAGNOSIS — L723 Sebaceous cyst: Secondary | ICD-10-CM | POA: Insufficient documentation

## 2023-08-04 MED ORDER — KETOROLAC TROMETHAMINE 30 MG/ML IJ SOLN
30.0000 mg | Freq: Once | INTRAMUSCULAR | Status: AC
  Administered 2023-08-04: 30 mg via INTRAMUSCULAR

## 2023-08-04 MED ORDER — TIZANIDINE HCL 4 MG PO TABS: 4.0000 mg | ORAL_TABLET | Freq: Three times a day (TID) | ORAL | 0 refills | Status: AC | PRN

## 2023-08-04 NOTE — ED Provider Notes (Signed)
 [facility]    CSN: [id number] Arrival date & time: 08/04/23  1738      History   Chief Complaint Chief Complaint  Patient presents with   Back Pain    HPI Carrie Bullock is a 33 y.o. female.   Back pain X 2 days, started on Sunday after she tripped over son's playstation - did not fall but her foot got caught on the cords. Afterwards took a few steps to the couch and started experiencing severe pain Started in lower back but now feels it in mid/upper back Yesterday pain was very severe and she was close to calling EMS Feels unable to stand up for prolong period of time  Pain is described as sharp and heavy, constant. Worse with movement. Patient has tried tylenol , bengay, pain patches, warm bath with minimal improvement. Pain does not radiate into legs  Prior history of similar pain: No History of cancer: No Weak immune system:  No History of steroid use: No  Incontinence of bowel or bladder:  No Numbness of leg: No Fever: No Rest or Night pain: No Weight Loss:  No  LMP beginning of June, regular, not concerned about pregnancy currently   Back Pain   Past Medical History:  Diagnosis Date   Headache    Pregnancy induced hypertension     Patient Active Problem List   Diagnosis Date Noted   Obstetric disorder of uterus 08/04/2023   Obesity 08/04/2023   Injury of abdomen 08/04/2023   Heartburn 08/04/2023   Folliculitis 08/04/2023   Bacterial vaginosis 08/04/2023   Abdominal pain 08/04/2023   Fetal growth restriction antepartum 08/04/2023   Sebaceous cyst 08/04/2023   Smokes tobacco daily 08/04/2023   Subchorionic hematoma 08/04/2023   Vitamin D3 deficiency 08/04/2023   SVD (spontaneous vaginal delivery) 04/01/2014   IUGR (intrauterine growth restriction)    [redacted] weeks gestation of pregnancy    Severe preeclampsia 03/31/2014   Preeclampsia, severe 03/31/2014   Preeclampsia 03/29/2014    History reviewed. No pertinent surgical  history.  OB History     Gravida  3   Para  2   Term      Preterm  1   AB  1   Living  1      SAB      IAB  1   Ectopic      Multiple  0   Live Births  1            Home Medications    Prior to Admission medications   Medication Sig Start Date End Date Taking? Authorizing Provider  Cholecalciferol 1.25 MG (50000 UT) capsule Take 50,000 Units by mouth daily. 07/02/18  Yes [provider]  cyclobenzaprine (FLEXERIL) 10 MG tablet Take 10 mg by mouth 3 (three) times daily. 07/02/18  Yes [provider]  famotidine  (PEPCID ) 20 MG tablet Take 20 mg by mouth daily. 04/03/18  Yes [provider]  hydrocortisone-pramoxine (PROCTOFOAM HC) rectal foam Place 1 applicator rectally as directed. 04/11/14  Yes [provider]  ibuprofen  (ADVIL ) 600 MG tablet Take 600 mg by mouth every 6 (six) hours as needed. 04/03/18  Yes [provider]  metroNIDAZOLE (FLAGYL) 500 MG tablet Take 500 mg by mouth as directed. 11/14/13  Yes [provider]  NIFEdipine  (PROCARDIA -XL/NIFEDICAL-XL) 30 MG 24 hr tablet Take 30 mg by mouth daily. 07/02/18  Yes [provider]  pantoprazole  (PROTONIX ) 20 MG tablet Take 20 mg by mouth daily. 04/03/18  Yes [provider]  promethazine (PHENERGAN) 25 MG tablet Take 25 mg by mouth every 6 (six) hours as needed. 10/22/17  Yes [provider]  propranolol ER (INDERAL LA) 60 MG 24 hr capsule Take 60 mg by mouth daily. 09/25/17  Yes [provider]  tiZANidine (ZANAFLEX) 4 MG tablet Take 1 tablet (4 mg total) by mouth every 8 (eight) hours as needed for muscle spasms. 08/04/23  Yes Romelle Booty, MD  triamcinolone cream (KENALOG) 0.1 % Apply 1 Application topically 2 (two) times daily. 05/03/19  Yes [provider]  baclofen (LIORESAL) 10 MG tablet Take 10 mg by mouth at bedtime as needed. 10/26/21   [provider]  LO LOESTRIN FE 1 MG-10 MCG / 10 MCG tablet Take 1  tablet by mouth daily. 09/06/21   [provider]    Family History Family History  Problem Relation Age of Onset   Hypertension Mother     Social History Social History   Tobacco Use   Smoking status: Every Day    Current packs/day: 0.00    Types: Cigarettes    Last attempt to quit: 03/06/2014    Years since quitting: 9.4   Smokeless tobacco: Never  Vaping Use   Vaping status: Never Used  Substance Use Topics   Alcohol use: Yes    Comment: Occassionally.   Drug use: No     Allergies   Patient has no known allergies.   Review of Systems Review of Systems  Musculoskeletal:  Positive for back pain.  Otherwise per HPI   Physical Exam Triage Vital Signs ED Triage Vitals  Encounter Vitals Group     BP 08/04/23 1758 135/83     Girls Systolic BP Percentile --      Girls Diastolic BP Percentile --      Boys Systolic BP Percentile --      Boys Diastolic BP Percentile --      Pulse Rate 08/04/23 1758 85     Resp 08/04/23 1758 18     Temp --      Temp Source 08/04/23 1758 Oral     SpO2 08/04/23 1758 96 %     Weight 08/04/23 1754 225 lb (102.1 kg)     Height 08/04/23 1754 5' 7 (1.702 m)     Head Circumference --      Peak Flow --      Pain Score 08/04/23 1751 8     Pain Loc --      Pain Education --      Exclude from Growth Chart --    No data found.  Updated Vital Signs BP 135/83 (BP Location: Left Arm)   Pulse 85   Resp 18   Ht 5' 7 (1.702 m)   Wt 102.1 kg   LMP 07/09/2023 (Exact Date)   SpO2 96%   BMI 35.24 kg/m   Visual Acuity Right Eye Distance:   Left Eye Distance:   Bilateral Distance:    Right Eye Near:   Left Eye Near:    Bilateral Near:     Physical Exam Constitutional:      Comments: Appears uncomfortable, tearful  HENT:     Head: Normocephalic and atraumatic.   Eyes:     Extraocular Movements: Extraocular movements intact.     Pupils: Pupils are equal, round, and reactive to light.    Cardiovascular:     Rate and  Rhythm: Normal rate and regular rhythm.     Heart  sounds: Normal heart sounds.  Pulmonary:     Effort: No respiratory distress.     Breath sounds: Normal breath sounds.  Abdominal:     General: Abdomen is flat. There is no distension.     Palpations: Abdomen is soft.     Tenderness: There is no abdominal tenderness.   Musculoskeletal:        General: Tenderness present. No swelling or deformity.     Cervical back: Normal range of motion and neck supple.     Comments: TTP lower lumbar spine and L paraspinal muscles. Negative straight leg test bilaterally. Normal sensation and motor function in b/l lower extremities   Skin:    General: Skin is warm and dry.   Neurological:     General: No focal deficit present.     Mental Status: She is alert. Mental status is at baseline.     Sensory: No sensory deficit.     Motor: No weakness.      UC Treatments / Results  Labs (all labs ordered are listed, but only abnormal results are displayed) Labs Reviewed - No data to display  EKG   Radiology No results found.  Procedures Procedures (including critical care time)  Medications Ordered in UC Medications  ketorolac (TORADOL) 30 MG/ML injection 30 mg (30 mg Intramuscular Given 08/04/23 1836)    Initial Impression / Assessment and Plan / UC Course  I have reviewed the triage vital signs and the nursing notes.  Pertinent labs & imaging results that were available during my care of the patient were reviewed by me and considered in my medical decision making (see chart for details).      Low back pain after what sounds like a hyperextension injury when tripping on her son's playstation two days ago Some midline tenderness to palpation on exam over lumbar spine, additionally has some L sided paraspinal tenderness No red flags or radicular symptoms making acute spinal cord compression or disc protrusion less likely  Obtain lumbar spine x ray today given some point tenderness on  exam although I feel fracture is pretty unlikely Suspect more likely muscular in nature  Give toradol injection here Rx tizanidine PRN  Continue OTC pain control with tylenol , ibuprofen  prn  Recommend PCP f/u, consider PT  7:10 PM X ray does not show any obvious fracture, malalignment, or disc space abnormalities; staff to f/u on final read   Discussed red flags and return precautions   Final Clinical Impressions(s) / UC Diagnoses   Final diagnoses:  Acute low back pain without sciatica, unspecified back pain laterality     Discharge Instructions      Will let you know if your x ray shows any abnormalities  You can try tizanidine every 8 hours as needed for your pain. I have sent this to your pharmacy. This may make you drowsy. You can continue taking tylenol  and/or ibuprofen  as needed in addition to this. Do not use baclofen while taking this.  Please seek medical attention if your symptoms worsen and/or you develop any numbness or weakness in your legs, fevers, or lose control of your bowel/bladder  Please follow up with your PCP as you may benefit from physical therapy       ED Prescriptions     Medication Sig Dispense Auth. Provider   tiZANidine (ZANAFLEX) 4 MG tablet Take 1 tablet (4 mg total) by mouth every 8 (eight) hours as needed for muscle spasms. 90 tablet Romelle Booty, MD  PDMP not reviewed this encounter.   Romelle Booty, MD 08/04/23 1910

## 2023-08-04 NOTE — ED Triage Notes (Signed)
 I am having really really bad back pain, starting Sunday night after tripping/pulling son's play station cord out of the wall when walking, no obvious strain/injury at that time but later after 5-6 steps pain was terrible and continues with some radiation of pain.

## 2023-08-04 NOTE — Discharge Instructions (Addendum)
 Will let you know if your x ray shows any abnormalities  You can try tizanidine every 8 hours as needed for your pain. I have sent this to your pharmacy. This may make you drowsy. You can continue taking tylenol  and/or ibuprofen  as needed in addition to this. Do not use baclofen while taking this.  Please seek medical attention if your symptoms worsen and/or you develop any numbness or weakness in your legs, fevers, or lose control of your bowel/bladder  Please follow up with your PCP as you may benefit from physical therapy
